# Patient Record
Sex: Female | Born: 1935 | Race: White | Hispanic: No | Smoking: Former smoker
Health system: Southern US, Community
[De-identification: ages and names within clinical notes are randomized; demographics above are authoritative.]

## PROBLEM LIST (undated history)

## (undated) DIAGNOSIS — F32A Depression, unspecified: Secondary | ICD-10-CM

## (undated) DIAGNOSIS — I272 Pulmonary hypertension, unspecified: Secondary | ICD-10-CM

## (undated) DIAGNOSIS — M48061 Spinal stenosis, lumbar region without neurogenic claudication: Secondary | ICD-10-CM

## (undated) DIAGNOSIS — F329 Major depressive disorder, single episode, unspecified: Secondary | ICD-10-CM

## (undated) DIAGNOSIS — I5032 Chronic diastolic (congestive) heart failure: Secondary | ICD-10-CM

## (undated) DIAGNOSIS — I509 Heart failure, unspecified: Secondary | ICD-10-CM

## (undated) DIAGNOSIS — F0390 Unspecified dementia without behavioral disturbance: Secondary | ICD-10-CM

## (undated) DIAGNOSIS — C189 Malignant neoplasm of colon, unspecified: Secondary | ICD-10-CM

## (undated) DIAGNOSIS — E039 Hypothyroidism, unspecified: Secondary | ICD-10-CM

## (undated) DIAGNOSIS — I10 Essential (primary) hypertension: Secondary | ICD-10-CM

## (undated) DIAGNOSIS — Z9981 Dependence on supplemental oxygen: Secondary | ICD-10-CM

## (undated) HISTORY — DX: Pulmonary hypertension, unspecified: I27.20

## (undated) HISTORY — DX: Unspecified dementia, unspecified severity, without behavioral disturbance, psychotic disturbance, mood disturbance, and anxiety: F03.90

## (undated) HISTORY — DX: Essential (primary) hypertension: I10

## (undated) HISTORY — DX: Chronic diastolic (congestive) heart failure: I50.32

## (undated) HISTORY — DX: Spinal stenosis, lumbar region without neurogenic claudication: M48.061

## (undated) HISTORY — DX: Hypothyroidism, unspecified: E03.9

## (undated) HISTORY — DX: Depression, unspecified: F32.A

## (undated) HISTORY — DX: Major depressive disorder, single episode, unspecified: F32.9

## (undated) HISTORY — DX: Malignant neoplasm of colon, unspecified: C18.9

---

## 1999-11-28 HISTORY — PX: CARDIAC CATHETERIZATION: SHX172

## 2005-10-27 HISTORY — PX: ABDOMINAL AORTIC ANEURYSM REPAIR: SUR1152

## 2007-09-28 DIAGNOSIS — C189 Malignant neoplasm of colon, unspecified: Secondary | ICD-10-CM

## 2007-09-28 HISTORY — PX: COLON RESECTION: SHX5231

## 2007-09-28 HISTORY — DX: Malignant neoplasm of colon, unspecified: C18.9

## 2007-12-13 ENCOUNTER — Encounter: Payer: Self-pay | Admitting: Internal Medicine

## 2007-12-29 ENCOUNTER — Encounter: Payer: Self-pay | Admitting: Internal Medicine

## 2008-01-26 ENCOUNTER — Encounter: Payer: Self-pay | Admitting: Internal Medicine

## 2009-11-27 HISTORY — PX: OTHER SURGICAL HISTORY: SHX169

## 2011-03-17 ENCOUNTER — Ambulatory Visit: Payer: Self-pay | Admitting: Internal Medicine

## 2011-04-28 ENCOUNTER — Ambulatory Visit: Payer: Self-pay | Admitting: Internal Medicine

## 2011-05-16 ENCOUNTER — Ambulatory Visit: Payer: Self-pay | Admitting: Internal Medicine

## 2011-05-24 ENCOUNTER — Ambulatory Visit: Payer: Self-pay | Admitting: Internal Medicine

## 2011-05-25 LAB — CEA: CEA: 2 ng/mL (ref 0.0–4.7)

## 2011-05-28 ENCOUNTER — Ambulatory Visit: Payer: Self-pay | Admitting: Internal Medicine

## 2011-11-23 ENCOUNTER — Ambulatory Visit: Payer: Self-pay | Admitting: Internal Medicine

## 2011-11-28 ENCOUNTER — Ambulatory Visit: Payer: Self-pay | Admitting: Internal Medicine

## 2012-03-21 LAB — BASIC METABOLIC PANEL
Anion Gap: 8 (ref 7–16)
BUN: 24 mg/dL — ABNORMAL HIGH (ref 7–18)
Calcium, Total: 8.8 mg/dL (ref 8.5–10.1)
Co2: 27 mmol/L (ref 21–32)
Creatinine: 0.92 mg/dL (ref 0.60–1.30)
EGFR (Non-African Amer.): 55 — ABNORMAL LOW
Osmolality: 288 (ref 275–301)
Sodium: 143 mmol/L (ref 136–145)

## 2012-03-21 LAB — CBC
HCT: 34.4 % — ABNORMAL LOW (ref 35.0–47.0)
MCH: 29.7 pg (ref 26.0–34.0)
MCHC: 32.2 g/dL (ref 32.0–36.0)
MCV: 92 fL (ref 80–100)
Platelet: 90 10*3/uL — ABNORMAL LOW (ref 150–440)
RDW: 15.1 % — ABNORMAL HIGH (ref 11.5–14.5)

## 2012-03-21 LAB — TROPONIN I: Troponin-I: <0.02 ng/mL

## 2012-03-22 LAB — TROPONIN I: Troponin-I: 0.02 ng/mL

## 2012-03-22 LAB — CK TOTAL AND CKMB (NOT AT ARMC)
CK, Total: 58 U/L (ref 21–215)
CK-MB: 1.4 ng/mL (ref 0.5–3.6)
CK-MB: 1.1 ng/mL (ref 0.5–3.6)

## 2012-03-22 LAB — LIPID PANEL
Cholesterol: 154 mg/dL (ref 0–200)
HDL Cholesterol: 53 mg/dL (ref 40–60)

## 2012-03-22 LAB — CBC WITH DIFFERENTIAL/PLATELET
Eosinophil #: 0.1 10*3/uL (ref 0.0–0.7)
Eosinophil %: 2.3 %
HGB: 10.5 g/dL — ABNORMAL LOW (ref 12.0–16.0)
MCHC: 32.9 g/dL (ref 32.0–36.0)
Monocyte %: 5.3 %
Neutrophil #: 2.6 10*3/uL (ref 1.4–6.5)
Neutrophil %: 59 %
RBC: 3.49 10*6/uL — ABNORMAL LOW (ref 3.80–5.20)
RDW: 15.1 % — ABNORMAL HIGH (ref 11.5–14.5)
WBC: 4.4 10*3/uL (ref 3.6–11.0)

## 2012-03-22 LAB — URINALYSIS, COMPLETE
Bilirubin,UR: NEGATIVE
Blood: NEGATIVE
Glucose,UR: NEGATIVE mg/dL (ref 0–75)
Hyaline Cast: 3
Nitrite: NEGATIVE
Ph: 5 (ref 4.5–8.0)
Specific Gravity: 1.01 (ref 1.003–1.030)
WBC UR: 10 /HPF (ref 0–5)

## 2012-03-22 LAB — BASIC METABOLIC PANEL
BUN: 22 mg/dL — ABNORMAL HIGH (ref 7–18)
Calcium, Total: 8.4 mg/dL — ABNORMAL LOW (ref 8.5–10.1)
Creatinine: 0.88 mg/dL (ref 0.60–1.30)
Glucose: 123 mg/dL — ABNORMAL HIGH (ref 65–99)
Potassium: 3.5 mmol/L (ref 3.5–5.1)
Sodium: 143 mmol/L (ref 136–145)

## 2012-03-23 ENCOUNTER — Inpatient Hospital Stay: Payer: Self-pay | Admitting: Internal Medicine

## 2012-03-23 LAB — CBC WITH DIFFERENTIAL/PLATELET
Basophil #: 0.1 10*3/uL (ref 0.0–0.1)
Basophil %: 2.8 %
HGB: 10.6 g/dL — ABNORMAL LOW (ref 12.0–16.0)
Neutrophil #: 2 10*3/uL (ref 1.4–6.5)
Neutrophil %: 57.1 %
Platelet: 77 10*3/uL — ABNORMAL LOW (ref 150–440)
RDW: 15.4 % — ABNORMAL HIGH (ref 11.5–14.5)

## 2012-03-23 LAB — CREATININE, SERUM
Creatinine: 0.97 mg/dL (ref 0.60–1.30)
EGFR (African American): >60

## 2012-03-25 LAB — CBC WITH DIFFERENTIAL/PLATELET
Basophil #: 0.1 10*3/uL (ref 0.0–0.1)
Basophil %: 2.5 %
Eosinophil %: 3.2 %
Lymphocyte #: 1.2 10*3/uL (ref 1.0–3.6)
Lymphocyte %: 31.3 %
MCV: 92 fL (ref 80–100)
Monocyte #: 0.2 x10 3/mm (ref 0.2–0.9)
Neutrophil %: 57.5 %
RBC: 3.73 10*6/uL — ABNORMAL LOW (ref 3.80–5.20)
WBC: 3.8 10*3/uL (ref 3.6–11.0)

## 2012-03-26 LAB — CBC WITH DIFFERENTIAL/PLATELET
Basophil #: 0.1 10*3/uL (ref 0.0–0.1)
Basophil %: 2.3 %
Eosinophil %: 3.1 %
HCT: 38.2 % (ref 35.0–47.0)
Lymphocyte #: 1.8 10*3/uL (ref 1.0–3.6)
MCH: 29.6 pg (ref 26.0–34.0)
MCHC: 31.9 g/dL — ABNORMAL LOW (ref 32.0–36.0)
MCV: 93 fL (ref 80–100)
Monocyte #: 0.3 x10 3/mm (ref 0.2–0.9)
Monocyte %: 5.3 %
Neutrophil %: 58.6 %
Platelet: 95 10*3/uL — ABNORMAL LOW (ref 150–440)
RBC: 4.11 10*6/uL (ref 3.80–5.20)
RDW: 14.9 % — ABNORMAL HIGH (ref 11.5–14.5)

## 2012-03-26 LAB — URINE CULTURE

## 2012-03-28 DIAGNOSIS — I10 Essential (primary) hypertension: Secondary | ICD-10-CM

## 2012-03-28 DIAGNOSIS — R55 Syncope and collapse: Secondary | ICD-10-CM

## 2012-03-28 DIAGNOSIS — F068 Other specified mental disorders due to known physiological condition: Secondary | ICD-10-CM

## 2012-04-26 ENCOUNTER — Telehealth: Payer: Self-pay

## 2012-04-26 NOTE — Telephone Encounter (Signed)
Harvie Junior daughter request call back from Dr Alphonsus Sias. Request discharge today from [facility] rehab.; Was told could not discharge until Tues and daughter wants to take pt home today. Pat's # [phone].

## 2012-04-26 NOTE — Telephone Encounter (Signed)
Spoke to [employer] at [employer] Just spoke to Medical illustrator about needing to wait till Tuesday and were okay  Please call her and confirm that they are now okay about waiting till Tuesday

## 2012-04-26 NOTE — Telephone Encounter (Signed)
Spoke to daughter Samantha Dickerson Has oxygen at home Ready to take her back---not going to [place]  Orders given for discharge May need Rx for new med until she sees Dr Delfin Edis

## 2012-08-28 ENCOUNTER — Ambulatory Visit: Payer: Self-pay | Admitting: Internal Medicine

## 2013-04-16 ENCOUNTER — Telehealth: Payer: Self-pay

## 2013-04-16 NOTE — Telephone Encounter (Signed)
Pts daughter, Elease Hashimoto left v/m at 3:50 pm requesting Dr Alphonsus Sias to take Ms Bosak as new pt; pt saw Dr Alphonsus Sias at rehab at [facility]. pts doctor is moving out of town. I called Elease Hashimoto and she had already called back and scheduled appt on 05/21/13 at 11:30.

## 2013-05-21 ENCOUNTER — Encounter: Payer: Self-pay | Admitting: Internal Medicine

## 2013-05-21 ENCOUNTER — Ambulatory Visit (INDEPENDENT_AMBULATORY_CARE_PROVIDER_SITE_OTHER): Payer: Medicare Other | Admitting: Internal Medicine

## 2013-05-21 VITALS — BP 112/60 | HR 61 | Temp 98.9°F | Ht 62.0 in | Wt 185.0 lb

## 2013-05-21 DIAGNOSIS — F0390 Unspecified dementia without behavioral disturbance: Secondary | ICD-10-CM

## 2013-05-21 DIAGNOSIS — M48061 Spinal stenosis, lumbar region without neurogenic claudication: Secondary | ICD-10-CM

## 2013-05-21 DIAGNOSIS — I5032 Chronic diastolic (congestive) heart failure: Secondary | ICD-10-CM | POA: Insufficient documentation

## 2013-05-21 DIAGNOSIS — C189 Malignant neoplasm of colon, unspecified: Secondary | ICD-10-CM | POA: Insufficient documentation

## 2013-05-21 DIAGNOSIS — I2789 Other specified pulmonary heart diseases: Secondary | ICD-10-CM

## 2013-05-21 DIAGNOSIS — E039 Hypothyroidism, unspecified: Secondary | ICD-10-CM | POA: Insufficient documentation

## 2013-05-21 DIAGNOSIS — F329 Major depressive disorder, single episode, unspecified: Secondary | ICD-10-CM

## 2013-05-21 DIAGNOSIS — I272 Pulmonary hypertension, unspecified: Secondary | ICD-10-CM

## 2013-05-21 DIAGNOSIS — I10 Essential (primary) hypertension: Secondary | ICD-10-CM | POA: Insufficient documentation

## 2013-05-21 LAB — HEPATIC FUNCTION PANEL
ALT: 9 U/L (ref 0–35)
AST: 11 U/L (ref 0–37)
Albumin: 4 g/dL (ref 3.5–5.2)
Total Bilirubin: 0.6 mg/dL (ref 0.3–1.2)

## 2013-05-21 LAB — CBC WITH DIFFERENTIAL/PLATELET
Basophils Absolute: 0 10*3/uL (ref 0.0–0.1)
Eosinophils Relative: 2.3 % (ref 0.0–5.0)
HCT: 34.6 % — ABNORMAL LOW (ref 36.0–46.0)
Lymphs Abs: 1.8 10*3/uL (ref 0.7–4.0)
MCV: 92.9 fl (ref 78.0–100.0)
Monocytes Absolute: 0.3 10*3/uL (ref 0.1–1.0)
Monocytes Relative: 4.5 % (ref 3.0–12.0)
Neutrophils Relative %: 63.7 % (ref 43.0–77.0)
Platelets: 84 10*3/uL — ABNORMAL LOW (ref 150.0–400.0)
RDW: 16.4 % — ABNORMAL HIGH (ref 11.5–14.6)
WBC: 6.2 10*3/uL (ref 4.5–10.5)

## 2013-05-21 LAB — BASIC METABOLIC PANEL
BUN: 29 mg/dL — ABNORMAL HIGH (ref 6–23)
Creatinine, Ser: 0.9 mg/dL (ref 0.4–1.2)
GFR: 61.67 mL/min (ref >60.00)
Glucose, Bld: 91 mg/dL (ref 70–99)
Potassium: 3.5 mEq/L (ref 3.5–5.1)

## 2013-05-21 LAB — T4, FREE: Free T4: 1.18 ng/dL (ref 0.60–1.60)

## 2013-05-21 MED ORDER — RIVASTIGMINE TARTRATE 1.5 MG PO CAPS: 1.5000 mg | ORAL_CAPSULE | Freq: Two times a day (BID) | ORAL | Status: DC

## 2013-05-21 MED ORDER — CLOPIDOGREL BISULFATE 75 MG PO TABS: 75.0000 mg | ORAL_TABLET | Freq: Every day | ORAL | Status: DC

## 2013-05-21 MED ORDER — SERTRALINE HCL 50 MG PO TABS: 50.0000 mg | ORAL_TABLET | Freq: Every day | ORAL | Status: DC

## 2013-05-21 MED ORDER — FUROSEMIDE 20 MG PO TABS: 20.0000 mg | ORAL_TABLET | Freq: Every day | ORAL | Status: DC | PRN

## 2013-05-21 MED ORDER — FERROUS SULFATE 325 (65 FE) MG PO TABS: 325.0000 mg | ORAL_TABLET | Freq: Every day | ORAL | Status: AC

## 2013-05-21 MED ORDER — LEVOTHYROXINE SODIUM 100 MCG PO TABS: 100.0000 ug | ORAL_TABLET | Freq: Every day | ORAL | Status: DC

## 2013-05-21 MED ORDER — METOPROLOL SUCCINATE ER 25 MG PO TB24: 25.0000 mg | ORAL_TABLET | Freq: Every day | ORAL | Status: DC

## 2013-05-21 NOTE — Progress Notes (Signed)
Subjective:    Patient ID: Samantha Dickerson, female    DOB: 1920-12-21, 79 y.o.   MRN: [id number]  HPI Here with daughter [name]  I had seen her at [facility] about a year ago Admitted after fall and then to rehab Transfering care as Dr Delfin Edis is leaving the area  Has had dementia Mild when came to live with daughter about 2.5 years ago Now worsening in the past year or so On rivastigmine Goes to [facility] 4 days per week Aide with her Tuesdays and Thursdays Dresses and bathroom by herself. Daughter helps with showers and instrumental ADLs Had wandered out of home in past---now supervised  Uses attends for urinary incontinence Has had recurrent UTIs  HTN for many years Has done well on the Rx  Diastolic CHF Echo done April 2013-- EF 55%, mild MR and TR No edema on meds Does have DOE Some degree of pulmonary hypertension as well---still on oxygen at night  Long standing hypothyroidism On replacement  Has spinal stenosis Has chronic back pain Just uses tylenol  Depression goes back for some time Seems to be doing okay on the sertraline  No current outpatient prescriptions on file prior to visit.   No current facility-administered medications on file prior to visit.    Allergies  Allergen Reactions  . Ace Inhibitors   . Aspirin   . Lisinopril   . Nitroglycerin   . Penicillins     Past Medical History  Diagnosis Date  . Dementia ?2010    unclear etiology  . Hypertension   . Unspecified hypothyroidism   . Colon cancer 11/08    resected and no other Rx  . Chronic diastolic heart failure     EF- 55% (4/13)  . Depression   . Lumbar spinal stenosis     Past Surgical History  Procedure Laterality Date  . Colon resection N/A 11/08    cancer  . Abdominal aortic aneurysm repair N/A 12/06  . Cardiac catheterization  2001    Family History  Problem Relation Age of Onset  . Heart disease Mother   . Heart disease Father   . Cancer Father   . Diabetes Neg Hx    . Cancer Other     History   Social History  . Marital Status: Widowed    Spouse Name: N/A    Number of Children: 5  . Years of Education: N/A   Occupational History  . 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 Smoking status: Former Games developer  . Smokeless tobacco: Never Used  . Alcohol Use: No  . Drug Use: No  . Sexually Active: Not on file   Other Topics Concern  . Not on file   Social History Narrative   Widowed 2002   Lives with daughter [name]       Has living will   Daughter [name] is health care POA   Has DNR order   No tube feeding if cognitively unaware   Review of Systems  Constitutional: Positive for fatigue. Negative for unexpected weight change.  HENT: Positive for hearing loss and dental problem.        Teeth not great---hasn't been to dentist  Eyes: Negative for redness and visual disturbance.  Respiratory: Positive for cough and shortness of breath.   Cardiovascular: Negative for chest pain, palpitations and leg swelling.  Gastrointestinal: Positive for abdominal pain and constipation. Negative for nausea, vomiting and blood in stool.  Occ heartburn Vague epigastric pain  Genitourinary: Negative for dysuria and hematuria.       Past UTIs Ongoing incontinence  Musculoskeletal: Positive for back pain and arthralgias. Negative for joint swelling.       Some pain in hands, shoulders at times  Skin: Negative for rash.       No suspicious lesions Has some chronic keratoses  Allergic/Immunologic: Positive for environmental allergies. Negative for immunocompromised state.       Uses OTC allergy meds prn  Neurological: Positive for weakness and light-headedness. Negative for dizziness and syncope.  Psychiatric/Behavioral: Positive for sleep disturbance. Negative for dysphoric mood. The patient is nervous/anxious.        Doesn't sleep well Longstanding issue Some daytime somnolence  Depression controlled       Objective:    Physical Exam  Constitutional: She appears well-developed and well-nourished. No distress.  HENT:  Mouth/Throat: Oropharynx is clear and moist. No oropharyngeal exudate.  Eyes: Conjunctivae and EOM are normal. Pupils are equal, round, and reactive to light.  Neck: Normal range of motion. Neck supple.  Thyroid palpable but not really enlarged  Cardiovascular: Normal rate, regular rhythm, normal heart sounds and intact distal pulses.  Exam reveals no gallop.   No murmur heard. occ extra beats  Pulmonary/Chest: Effort normal and breath sounds normal. No respiratory distress. She has no wheezes. She has no rales.  Abdominal: Soft. There is no tenderness.  Musculoskeletal: She exhibits no edema and no tenderness.  Thick calves without any pitting  Lymphadenopathy:    She has no cervical adenopathy.  Neurological: She is alert.  Oriented to person only President-- ?? 100- ??  Skin: No rash noted.  seb keratoses  Psychiatric: She has a normal mood and affect.  Appropriate interaction          Assessment & Plan:

## 2013-05-21 NOTE — Assessment & Plan Note (Signed)
Probably Alzheimers but ?vascular On rivastigmine

## 2013-05-21 NOTE — Addendum Note (Signed)
Addended by: Sueanne Margarita on: 05/21/2013 01:33 PM   Modules accepted: Orders

## 2013-05-21 NOTE — Assessment & Plan Note (Signed)
Controlled with the med Some sleep problems--- will try tylenol first

## 2013-05-21 NOTE — Assessment & Plan Note (Signed)
Chronic dyspnea and uses oxygen at night

## 2013-05-21 NOTE — Assessment & Plan Note (Signed)
BP Readings from Last 3 Encounters:  05/21/13 112/60   Good control Continue current Rx

## 2013-05-21 NOTE — Assessment & Plan Note (Signed)
Fluid status seems controlled with the lasix Will check labs

## 2013-05-21 NOTE — Assessment & Plan Note (Signed)
Will check labs on the med 

## 2013-05-21 NOTE — Assessment & Plan Note (Signed)
Recommended regular acetaminophen

## 2013-05-28 ENCOUNTER — Encounter: Payer: Self-pay | Admitting: Internal Medicine

## 2013-06-17 ENCOUNTER — Emergency Department: Payer: Self-pay | Admitting: Emergency Medicine

## 2013-06-17 LAB — COMPREHENSIVE METABOLIC PANEL
Albumin: 3.5 g/dL (ref 3.4–5.0)
Alkaline Phosphatase: 79 U/L (ref 50–136)
Bilirubin,Total: 0.5 mg/dL (ref 0.2–1.0)
Calcium, Total: 8.6 mg/dL (ref 8.5–10.1)
Chloride: 106 mmol/L (ref 98–107)
Creatinine: 0.86 mg/dL (ref 0.60–1.30)
EGFR (Non-African Amer.): 59 — ABNORMAL LOW
Glucose: 89 mg/dL (ref 65–99)
SGOT(AST): 14 U/L — ABNORMAL LOW (ref 15–37)
SGPT (ALT): 11 U/L — ABNORMAL LOW (ref 12–78)
Sodium: 140 mmol/L (ref 136–145)
Total Protein: 6.9 g/dL (ref 6.4–8.2)

## 2013-06-17 LAB — CBC
HCT: 34 % — ABNORMAL LOW (ref 35.0–47.0)
HGB: 11.5 g/dL — ABNORMAL LOW (ref 12.0–16.0)
MCH: 31 pg (ref 26.0–34.0)
MCV: 92 fL (ref 80–100)
RBC: 3.71 10*6/uL — ABNORMAL LOW (ref 3.80–5.20)
WBC: 6.1 10*3/uL (ref 3.6–11.0)

## 2013-06-17 LAB — TROPONIN I: Troponin-I: <0.02 ng/mL

## 2013-06-17 LAB — URINALYSIS, COMPLETE
Bilirubin,UR: NEGATIVE
Ketone: NEGATIVE
Ph: 6 (ref 4.5–8.0)
Protein: NEGATIVE
RBC,UR: 2 /HPF (ref 0–5)
Specific Gravity: 1.008 (ref 1.003–1.030)
Squamous Epithelial: <1
WBC UR: 13 /HPF (ref 0–5)

## 2013-06-20 ENCOUNTER — Encounter: Payer: Self-pay | Admitting: Internal Medicine

## 2013-06-20 ENCOUNTER — Ambulatory Visit (INDEPENDENT_AMBULATORY_CARE_PROVIDER_SITE_OTHER): Payer: Medicare Other | Admitting: Internal Medicine

## 2013-06-20 VITALS — BP 108/60 | HR 72 | Temp 98.0°F

## 2013-06-20 DIAGNOSIS — I498 Other specified cardiac arrhythmias: Secondary | ICD-10-CM | POA: Insufficient documentation

## 2013-06-20 DIAGNOSIS — I5032 Chronic diastolic (congestive) heart failure: Secondary | ICD-10-CM

## 2013-06-20 DIAGNOSIS — K047 Periapical abscess without sinus: Secondary | ICD-10-CM | POA: Insufficient documentation

## 2013-06-20 MED ORDER — CLINDAMYCIN HCL 300 MG PO CAPS: 300.0000 mg | ORAL_CAPSULE | Freq: Three times a day (TID) | ORAL | Status: DC

## 2013-06-20 NOTE — Assessment & Plan Note (Signed)
Seems to be compensated Continue current meds

## 2013-06-20 NOTE — Progress Notes (Signed)
Subjective:    Patient ID: Samantha Dickerson, female    DOB: Oct 19, 1921, 79 y.o.   MRN: [id number]  HPI Here with daughter  Recent trip--all day in airport and planes Next day, was feeling dizzy BP was 139/89 but pulse was low (like 40) Called 911 after couldn't get up to go to bathroom  ER evaluation---reviewed Found bigeminy Has appt next week for follow up with Dr Gwen Pounds  Never passed out---just dizziness and trouble getting up No chest pain No SOB Only had her stomach bothering her  Left face is puffy Hard spot in cheek Seems to be drooping on left  Current Outpatient Prescriptions on File Prior to Visit  Medication Sig Dispense Refill  . acetaminophen (TYLENOL ARTHRITIS PAIN) 650 MG CR tablet Take 650 mg by mouth 3 (three) times daily.       . clopidogrel (PLAVIX) 75 MG tablet Take 1 tablet (75 mg total) by mouth daily.  90 tablet  3  . ferrous sulfate 325 (65 FE) MG tablet Take 1 tablet (325 mg total) by mouth daily with breakfast.  90 tablet  3  . furosemide (LASIX) 20 MG tablet Take 1 tablet (20 mg total) by mouth daily as needed.  90 tablet  3  . levothyroxine (SYNTHROID, LEVOTHROID) 100 MCG tablet Take 1 tablet (100 mcg total) by mouth daily before breakfast.  90 tablet  3  . metoprolol succinate (TOPROL-XL) 25 MG 24 hr tablet Take 1 tablet (25 mg total) by mouth daily.  90 tablet  3  . ranitidine (ZANTAC) 150 MG capsule Take 150 mg by mouth as needed for heartburn.      . rivastigmine (EXELON) 1.5 MG capsule Take 1 capsule (1.5 mg total) by mouth 2 (two) times daily.  180 capsule  3  . sertraline (ZOLOFT) 50 MG tablet Take 1 tablet (50 mg total) by mouth daily.  90 tablet  3   No current facility-administered medications on file prior to visit.    Allergies  Allergen Reactions  . Ace Inhibitors   . Aspirin   . Lisinopril   . Nitroglycerin   . Penicillins     Past Medical History  Diagnosis Date  . Dementia ?2010    unclear etiology  . Hypertension   .  Unspecified hypothyroidism   . Colon cancer 11/08    resected and no other Rx  . Chronic diastolic heart failure     EF- 55% (4/13)  . Depression   . Lumbar spinal stenosis   . Pulmonary hypertension     Past Surgical History  Procedure Laterality Date  . Colon resection N/A 11/08    cancer  . Abdominal aortic aneurysm repair N/A 12/06  . Cardiac catheterization  2001    Family History  Problem Relation Age of Onset  . Heart disease Mother   . Heart disease Father   . Cancer Father   . Diabetes Neg Hx   . Cancer Other     History   Social History  . Marital Status: Widowed    Spouse Name: N/A    Number of Children: 5  . Years of Education: N/A   Occupational History  . 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 Smoking status: Former Games developer  . Smokeless tobacco: Never Used  . Alcohol Use: No  . Drug Use: No  . Sexually Active: Not on file   Other Topics Concern  . Not on file   Social  History Narrative   Widowed 2002   Lives with daughter [name]       Has living will   Daughter [name] is health care POA   Has DNR order   No tube feeding if cognitively unaware   Review of Systems Eating okay No fevers No pain with eating     Objective:   Physical Exam  Constitutional: She appears well-developed and well-nourished. No distress.  HENT:  Red, warm tender nodule overlying left lower molars No oral lesions but tooth looks diseased  Cardiovascular: Normal rate, regular rhythm and normal heart sounds.  Exam reveals no gallop.   Bigeminal rhythm--all beats perfuse peripherally  Pulmonary/Chest: Effort normal and breath sounds normal. No respiratory distress. She has no wheezes. She has no rales.  Psychiatric: She has a normal mood and affect. Her behavior is normal.          Assessment & Plan:

## 2013-06-20 NOTE — Assessment & Plan Note (Signed)
All beats perfusing Probably not related to the dizziness (this was probably related to hard travel day the prior day) No further work up indicated

## 2013-06-20 NOTE — Patient Instructions (Signed)
Please set up a dental appointment

## 2013-06-20 NOTE — Assessment & Plan Note (Signed)
Apparent cause of mass and drooping in left cheek Will treat with clinda Needs dental appt

## 2013-06-23 ENCOUNTER — Ambulatory Visit: Payer: Medicare Other | Admitting: Internal Medicine

## 2013-07-11 ENCOUNTER — Ambulatory Visit (INDEPENDENT_AMBULATORY_CARE_PROVIDER_SITE_OTHER): Payer: Medicare Other | Admitting: Internal Medicine

## 2013-07-11 ENCOUNTER — Telehealth: Payer: Self-pay

## 2013-07-11 ENCOUNTER — Encounter: Payer: Self-pay | Admitting: Internal Medicine

## 2013-07-11 VITALS — BP 128/68 | HR 88 | Temp 98.4°F | Wt 183.0 lb

## 2013-07-11 DIAGNOSIS — N390 Urinary tract infection, site not specified: Secondary | ICD-10-CM | POA: Insufficient documentation

## 2013-07-11 LAB — POCT URINALYSIS DIPSTICK
Spec Grav, UA: <=1.005
Urobilinogen, UA: NEGATIVE
pH, UA: 7

## 2013-07-11 MED ORDER — CIPROFLOXACIN HCL 250 MG PO TABS: 250.0000 mg | ORAL_TABLET | Freq: Two times a day (BID) | ORAL | Status: DC

## 2013-07-11 NOTE — Telephone Encounter (Signed)
Will evaluate then 

## 2013-07-11 NOTE — Assessment & Plan Note (Signed)
Had blood on pad but clearly grossly bloody urine Strong leukocytes on dip also  Will treat as UTI cipro for 10 days  Send culture

## 2013-07-11 NOTE — Progress Notes (Signed)
Subjective:    Patient ID: Samantha Dickerson, female    DOB: 26-May-1921, 79 y.o.   MRN: [id number]  HPI Here with daughter  Has been complaining to daughter that she has had some blood on her pads Not sure from where Daughter has seen some small spots but nothing major Patient making a big deal of it  Did complain of some dysuria only today  Daughter notes increased caregiver strain Consulting civil engineer to care for her Obstinate about things (like this possibly minor problem)---- "acting like she is about to die"  Current Outpatient Prescriptions on File Prior to Visit  Medication Sig Dispense Refill  . acetaminophen (TYLENOL ARTHRITIS PAIN) 650 MG CR tablet Take 650 mg by mouth 3 (three) times daily.       . clindamycin (CLEOCIN) 300 MG capsule Take 1 capsule (300 mg total) by mouth 3 (three) times daily.  30 capsule  0  . clopidogrel (PLAVIX) 75 MG tablet Take 1 tablet (75 mg total) by mouth daily.  90 tablet  3  . ferrous sulfate 325 (65 FE) MG tablet Take 1 tablet (325 mg total) by mouth daily with breakfast.  90 tablet  3  . furosemide (LASIX) 20 MG tablet Take 1 tablet (20 mg total) by mouth daily as needed.  90 tablet  3  . levothyroxine (SYNTHROID, LEVOTHROID) 100 MCG tablet Take 1 tablet (100 mcg total) by mouth daily before breakfast.  90 tablet  3  . metoprolol succinate (TOPROL-XL) 25 MG 24 hr tablet Take 1 tablet (25 mg total) by mouth daily.  90 tablet  3  . ranitidine (ZANTAC) 150 MG capsule Take 150 mg by mouth as needed for heartburn.      . rivastigmine (EXELON) 1.5 MG capsule Take 1 capsule (1.5 mg total) by mouth 2 (two) times daily.  180 capsule  3  . sertraline (ZOLOFT) 50 MG tablet Take 1 tablet (50 mg total) by mouth daily.  90 tablet  3   No current facility-administered medications on file prior to visit.    Allergies  Allergen Reactions  . Ace Inhibitors   . Aspirin   . Lisinopril   . Nitroglycerin   . Penicillins     Past Medical History  Diagnosis Date  . Dementia  ?2010    unclear etiology  . Hypertension   . Unspecified hypothyroidism   . Colon cancer 11/08    resected and no other Rx  . Chronic diastolic heart failure     EF- 55% (4/13)  . Depression   . Lumbar spinal stenosis   . Pulmonary hypertension     Past Surgical History  Procedure Laterality Date  . Colon resection N/A 11/08    cancer  . Abdominal aortic aneurysm repair N/A 12/06  . Cardiac catheterization  2001    Family History  Problem Relation Age of Onset  . Heart disease Mother   . Heart disease Father   . Cancer Father   . Diabetes Neg Hx   . Cancer Other     History   Social History  . Marital Status: Widowed    Spouse Name: N/A    Number of Children: 5  . Years of Education: N/A   Occupational History  . 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 Smoking status: Former Games developer  . Smokeless tobacco: Never Used  . Alcohol Use: No  . Drug Use: No  . Sexual Activity: Not on file  Other Topics Concern  . Not on file   Social History Narrative   Widowed 2002   Lives with daughter [name]       Has living will   Daughter [name] is health care POA   Has DNR order   No tube feeding if cognitively unaware   Review of Systems No fever No apparent abdominal pain No change in bowels Appetite has been okay    Objective:   Physical Exam  Constitutional: She appears well-developed and well-nourished. No distress.  Abdominal: Soft. She exhibits no distension. There is no tenderness. There is no rebound.  Genitourinary:  No obvious vaginal or rectal lesions          Assessment & Plan:

## 2013-07-11 NOTE — Telephone Encounter (Signed)
[name] pts daughter said on 07/08/13 pt had small spot of blood on pad; on 07/09/13 daughter gave pt shower and could not see evidence of blood but after shower saw small amt of blood on pad again. Pat cannot tell where blood is coming from. Blood on pad is middle to back end of pad. [name] said pt has complained of pain when urinates(/ with dementia) and not sure if voiding more frequently due to encouraging pt to drink fluids. [name] said she would feel better if pt was seen. Scheduled pt for today at 4 pm.

## 2013-07-11 NOTE — Addendum Note (Signed)
Addended by: Sueanne Margarita on: 07/11/2013 04:37 PM   Modules accepted: Orders

## 2013-07-19 ENCOUNTER — Emergency Department: Payer: Self-pay | Admitting: Emergency Medicine

## 2013-07-19 LAB — URINALYSIS, COMPLETE
Squamous Epithelial: 40
WBC UR: 68 /HPF (ref 0–5)

## 2013-07-21 ENCOUNTER — Telehealth: Payer: Self-pay | Admitting: Family Medicine

## 2013-07-21 LAB — URINE CULTURE

## 2013-07-21 NOTE — Telephone Encounter (Signed)
[facility] Triage Call Report Triage Record Num: [id number] Operator: Jeraldine Loots Patient Name: Samantha Dickerson Call Date & Time: 07/19/2013 5:20:43PM Patient Phone: [phone] PCP: Tillman Abide Patient Gender: Female PCP Fax : [phone] Patient DOB: July 28, 1921 Practice Name: [facility] Reason for Call: Caller: [name]; PCP: Tillman Abide (Family Practice); CB#: [phone]; Call regarding Urinary Pain/Bleeding; Seen on 8/15 for UTI and started on Cipro 250mg  po bid. Has 1 1/2 days left. Was getting some relief with the pain and decreased blood seen. Today she had increased blood, "like a menstrual flow". The initial u/a had blood. No fever that she is aware of. Triaged per Bloody urine, needs to be seen. Sent to UC at [facility]. Protocol(s) Used: Bloody Urine Recommended Outcome per Protocol: Call Provider Immediately Reason for Outcome: Having more bleeding OR bleeding is lasting longer than expected as defined by provider's follow-up precautions Care Advice: ~ 07/19/2013 5:27:12PM Page 1 of 1 [id number]

## 2013-07-21 NOTE — Telephone Encounter (Signed)
Please check on her today

## 2013-07-22 ENCOUNTER — Telehealth: Payer: Self-pay | Admitting: Internal Medicine

## 2013-07-22 MED ORDER — SULFAMETHOXAZOLE-TMP DS 800-160 MG PO TABS: 1.0000 | ORAL_TABLET | Freq: Two times a day (BID) | ORAL | Status: DC

## 2013-07-22 NOTE — Telephone Encounter (Signed)
Will change her to the septra DS Never got the phone call about the culture and so never used the sulfa  May need urology eval but this will be difficult with her dementia May have bladder lesion if bleeding doesn't respond to the antibiotic  Advised to bring to ER if the confusion worsens

## 2013-07-22 NOTE — Telephone Encounter (Signed)
Daughter, [name] callled and pt is still having issues with UTI and blood in urine.  Went to [facility] ER on 07/19/13 and would like to talk to Dr, Alphonsus Sias about the next step.  Best number to call [name] is [phone] cell or work # [phone].

## 2013-07-22 NOTE — Telephone Encounter (Signed)
Had to go to ER on 8/23 for bright red blood in urine still  Changed to macrobid Noted to be weak and shaky at the [facility] yesterday Appetite is okay  Noted some trouble voiding--- but still seeing urine and blood on her pads Aide found her in bed later in day almost naked Increasing confusion

## 2013-07-25 ENCOUNTER — Encounter: Payer: Self-pay | Admitting: Internal Medicine

## 2013-07-26 ENCOUNTER — Emergency Department: Payer: Self-pay | Admitting: Emergency Medicine

## 2013-07-26 LAB — COMPREHENSIVE METABOLIC PANEL
Albumin: 3 g/dL — ABNORMAL LOW (ref 3.4–5.0)
Alkaline Phosphatase: 68 U/L (ref 50–136)
Anion Gap: 7 (ref 7–16)
BUN: 23 mg/dL — ABNORMAL HIGH (ref 7–18)
Bilirubin,Total: 0.4 mg/dL (ref 0.2–1.0)
Calcium, Total: 8.4 mg/dL — ABNORMAL LOW (ref 8.5–10.1)
Creatinine: 1.47 mg/dL — ABNORMAL HIGH (ref 0.60–1.30)
EGFR (African American): 36 — ABNORMAL LOW
EGFR (Non-African Amer.): 31 — ABNORMAL LOW
Glucose: 109 mg/dL — ABNORMAL HIGH (ref 65–99)
Potassium: 3.8 mmol/L (ref 3.5–5.1)
SGOT(AST): 12 U/L — ABNORMAL LOW (ref 15–37)
SGPT (ALT): 13 U/L (ref 12–78)
Sodium: 140 mmol/L (ref 136–145)

## 2013-07-26 LAB — URINALYSIS, COMPLETE
Glucose,UR: NEGATIVE mg/dL (ref 0–75)
Nitrite: POSITIVE
Ph: 8 (ref 4.5–8.0)
Protein: >=500
RBC,UR: 6449 /HPF (ref 0–5)
WBC UR: 209 /HPF (ref 0–5)

## 2013-07-26 LAB — CBC
HGB: 10.1 g/dL — ABNORMAL LOW (ref 12.0–16.0)
MCHC: 33.6 g/dL (ref 32.0–36.0)
MCV: 91 fL (ref 80–100)
RBC: 3.28 10*6/uL — ABNORMAL LOW (ref 3.80–5.20)
RDW: 14.8 % — ABNORMAL HIGH (ref 11.5–14.5)
WBC: 6.7 10*3/uL (ref 3.6–11.0)

## 2013-07-29 ENCOUNTER — Encounter: Payer: Self-pay | Admitting: Internal Medicine

## 2013-07-31 ENCOUNTER — Telehealth: Payer: Self-pay | Admitting: *Deleted

## 2013-07-31 LAB — URINE CULTURE

## 2013-07-31 NOTE — Telephone Encounter (Signed)
Report faxed to Dr. Isabel Caprice @ [facility]

## 2013-07-31 NOTE — Telephone Encounter (Signed)
Call from [facility] ED about pt's urine culture report ( on your desk) per the nurse this is resistant to everything, she doesn't know what else to do but understands pt is seeing a urologist tomorrow? They did give her Macrobid, but its resistant to that too. please advise

## 2013-07-31 NOTE — Telephone Encounter (Signed)
With multiple organisms, might have something else going on (like a stone, etc) Please fax this report to the urologist

## 2013-08-01 ENCOUNTER — Other Ambulatory Visit: Payer: Self-pay | Admitting: Internal Medicine

## 2013-08-04 NOTE — Telephone Encounter (Signed)
She does not need a refill of this The urine was resistant She has or had appt with urology--- we need to find out what happened at that appt

## 2013-08-05 MED ORDER — SULFAMETHOXAZOLE-TMP DS 800-160 MG PO TABS: 1.0000 | ORAL_TABLET | Freq: Two times a day (BID) | ORAL | Status: DC

## 2013-08-05 NOTE — Telephone Encounter (Signed)
Please check on her

## 2013-08-05 NOTE — Telephone Encounter (Signed)
See email--

## 2013-08-18 ENCOUNTER — Encounter: Payer: Self-pay | Admitting: Internal Medicine

## 2013-09-17 ENCOUNTER — Ambulatory Visit (INDEPENDENT_AMBULATORY_CARE_PROVIDER_SITE_OTHER): Payer: Medicare Other

## 2013-09-17 DIAGNOSIS — Z23 Encounter for immunization: Secondary | ICD-10-CM

## 2013-09-18 ENCOUNTER — Ambulatory Visit (INDEPENDENT_AMBULATORY_CARE_PROVIDER_SITE_OTHER): Payer: Medicare Other | Admitting: Internal Medicine

## 2013-09-18 ENCOUNTER — Encounter: Payer: Self-pay | Admitting: Internal Medicine

## 2013-09-18 VITALS — BP 128/70 | HR 88 | Temp 98.2°F | Ht 62.0 in | Wt 179.0 lb

## 2013-09-18 DIAGNOSIS — F32A Depression, unspecified: Secondary | ICD-10-CM

## 2013-09-18 DIAGNOSIS — I5032 Chronic diastolic (congestive) heart failure: Secondary | ICD-10-CM

## 2013-09-18 DIAGNOSIS — N390 Urinary tract infection, site not specified: Secondary | ICD-10-CM

## 2013-09-18 DIAGNOSIS — M48061 Spinal stenosis, lumbar region without neurogenic claudication: Secondary | ICD-10-CM

## 2013-09-18 DIAGNOSIS — F0390 Unspecified dementia without behavioral disturbance: Secondary | ICD-10-CM

## 2013-09-18 DIAGNOSIS — F329 Major depressive disorder, single episode, unspecified: Secondary | ICD-10-CM

## 2013-09-18 MED ORDER — SERTRALINE HCL 100 MG PO TABS: 100.0000 mg | ORAL_TABLET | Freq: Every day | ORAL | Status: AC

## 2013-09-18 NOTE — Patient Instructions (Signed)
Please increase the sertraline to 100mg daily.

## 2013-09-18 NOTE — Assessment & Plan Note (Signed)
Seems compensated No changes

## 2013-09-18 NOTE — Assessment & Plan Note (Signed)
Has worsened Will increase the sertraline to 100mg  daily

## 2013-09-18 NOTE — Assessment & Plan Note (Addendum)
Long course of parenteral treatment without success If she needs more parenteral Rx--we should have her in hospital and transition to SNF Samantha Dickerson with PICC line) Daughter will not be able to manage her with a foley at home (if that is needed)

## 2013-09-18 NOTE — Assessment & Plan Note (Signed)
Pain doesn't seem to be an issue

## 2013-09-18 NOTE — Assessment & Plan Note (Signed)
Has had increased care needs Discussed seeking placement--will do FL-2 if needed

## 2013-09-18 NOTE — Progress Notes (Signed)
Subjective:    Patient ID: Samantha Dickerson, female    DOB: 1921/10/28, 79 y.o.   MRN: [id number]  HPI Here with 2 daughters-- [name] and [name] (from [place])  Persistent UTI Shots of ceftriaxone for 1 month---daughter giving at home Then oral antibiotic along with it Still with hematuria No fever Not sure what the next step is--will be stopping the antibiotic Considering foley to control incontinence  Needs help for ADLs--- bathing and dressing She will go to bathroom but is incontinent and needs help with incontinence care Walks with rollator  Mood has been an issue lately Feels tired Talking about being ready to die Sleep is variable---often will wander at night  Goes to the [facility] 3 days per week Daughter considering SNF or memory care because care at home has been too difficult  Current Outpatient Prescriptions on File Prior to Visit  Medication Sig Dispense Refill  . acetaminophen (TYLENOL ARTHRITIS PAIN) 650 MG CR tablet Take 650 mg by mouth 3 (three) times daily.       . clopidogrel (PLAVIX) 75 MG tablet Take 1 tablet (75 mg total) by mouth daily.  90 tablet  3  . ferrous sulfate 325 (65 FE) MG tablet Take 1 tablet (325 mg total) by mouth daily with breakfast.  90 tablet  3  . furosemide (LASIX) 20 MG tablet Take 1 tablet (20 mg total) by mouth daily as needed.  90 tablet  3  . levothyroxine (SYNTHROID, LEVOTHROID) 100 MCG tablet Take 1 tablet (100 mcg total) by mouth daily before breakfast.  90 tablet  3  . metoprolol succinate (TOPROL-XL) 25 MG 24 hr tablet Take 1 tablet (25 mg total) by mouth daily.  90 tablet  3  . ranitidine (ZANTAC) 150 MG capsule Take 150 mg by mouth as needed for heartburn.      . rivastigmine (EXELON) 1.5 MG capsule Take 1 capsule (1.5 mg total) by mouth 2 (two) times daily.  180 capsule  3  . sertraline (ZOLOFT) 50 MG tablet Take 1 tablet (50 mg total) by mouth daily.  90 tablet  3   No current facility-administered medications on file prior to  visit.    Allergies  Allergen Reactions  . Ace Inhibitors   . Aspirin   . Lisinopril   . Nitroglycerin   . Penicillins     Past Medical History  Diagnosis Date  . Dementia ?2010    unclear etiology  . Hypertension   . Unspecified hypothyroidism   . Colon cancer 11/08    resected and no other Rx  . Chronic diastolic heart failure     EF- 55% (4/13)  . Depression   . Lumbar spinal stenosis   . Pulmonary hypertension     Past Surgical History  Procedure Laterality Date  . Colon resection N/A 11/08    cancer  . Abdominal aortic aneurysm repair N/A 12/06  . Cardiac catheterization  2001    Family History  Problem Relation Age of Onset  . Heart disease Mother   . Heart disease Father   . Cancer Father   . Diabetes Neg Hx   . Cancer Other     History   Social History  . Marital Status: Widowed    Spouse Name: N/A    Number of Children: 5  . Years of Education: N/A   Occupational History  . 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 Smoking status: Former Games developer  .  Smokeless tobacco: Never Used  . Alcohol Use: No  . Drug Use: No  . Sexual Activity: Not on file   Other Topics Concern  . Not on file   Social History Narrative   Widowed 2002   Lives with daughter [name]       Has living will   Daughter [name] is health care POA   Has DNR order   No tube feeding if cognitively unaware   Review of Systems Appetite is still fine Weight down 4#---some stomach upset from the antibiotics (upper GI)    Objective:   Physical Exam  Constitutional: She appears well-developed and well-nourished. No distress.  Neck: Normal range of motion. Neck supple. No thyromegaly present.  Cardiovascular: Normal rate and regular rhythm.  Exam reveals no gallop.   Murmur heard. Soft systolic murmur  Pulmonary/Chest: Effort normal and breath sounds normal. No respiratory distress. She has no wheezes. She has no rales.  Abdominal: Soft. There is  tenderness.  Suprapubic tenderness  Musculoskeletal: She exhibits no edema and no tenderness.  Lymphadenopathy:    She has no cervical adenopathy.  Psychiatric: She has a normal mood and affect. Her behavior is normal.          Assessment & Plan:

## 2013-09-22 ENCOUNTER — Ambulatory Visit (INDEPENDENT_AMBULATORY_CARE_PROVIDER_SITE_OTHER): Payer: Medicare Other | Admitting: *Deleted

## 2013-09-22 DIAGNOSIS — Z111 Encounter for screening for respiratory tuberculosis: Secondary | ICD-10-CM

## 2013-09-25 ENCOUNTER — Other Ambulatory Visit: Payer: Self-pay | Admitting: Urology

## 2013-09-25 LAB — TB SKIN TEST: TB Skin Test: NEGATIVE

## 2013-09-25 NOTE — Progress Notes (Signed)
Need orders in EPIC.  Surgery scheduled for 10/03/13.  Thank You.  

## 2013-09-26 ENCOUNTER — Encounter: Payer: Self-pay | Admitting: Internal Medicine

## 2013-09-26 ENCOUNTER — Ambulatory Visit (INDEPENDENT_AMBULATORY_CARE_PROVIDER_SITE_OTHER): Payer: Medicare Other | Admitting: Internal Medicine

## 2013-09-26 DIAGNOSIS — N390 Urinary tract infection, site not specified: Secondary | ICD-10-CM

## 2013-09-26 NOTE — Progress Notes (Signed)
Subjective:    Patient ID: Samantha Dickerson, female    DOB: [birth year], 79 y.o.   MRN: [id number]  HPI Daughter here for consultation about mom's urologic problems  Proposed cystoscopy and biopsy-- saw irregular area Now off the antibiotics Still with hematuria No fever though  Has had more confusion lately   Current Outpatient Prescriptions on File Prior to Visit  Medication Sig Dispense Refill  . acetaminophen (TYLENOL ARTHRITIS PAIN) 650 MG CR tablet Take 650 mg by mouth 3 (three) times daily.       . clopidogrel (PLAVIX) 75 MG tablet Take 1 tablet (75 mg total) by mouth daily.  90 tablet  3  . ferrous sulfate 325 (65 FE) MG tablet Take 1 tablet (325 mg total) by mouth daily with breakfast.  90 tablet  3  . furosemide (LASIX) 20 MG tablet Take 1 tablet (20 mg total) by mouth daily as needed.  90 tablet  3  . levothyroxine (SYNTHROID, LEVOTHROID) 100 MCG tablet Take 1 tablet (100 mcg total) by mouth daily before breakfast.  90 tablet  3  . metoprolol succinate (TOPROL-XL) 25 MG 24 hr tablet Take 1 tablet (25 mg total) by mouth daily.  90 tablet  3  . ranitidine (ZANTAC) 150 MG capsule Take 150 mg by mouth as needed for heartburn.      . rivastigmine (EXELON) 1.5 MG capsule Take 1 capsule (1.5 mg total) by mouth 2 (two) times daily.  180 capsule  3  . sertraline (ZOLOFT) 100 MG tablet Take 1 tablet (100 mg total) by mouth daily.  30 tablet  11   No current facility-administered medications on file prior to visit.    Allergies  Allergen Reactions  . Ace Inhibitors   . Aspirin   . Lisinopril   . Nitroglycerin   . Penicillins     Past Medical History  Diagnosis Date  . Dementia ?2010    unclear etiology  . Hypertension   . Unspecified hypothyroidism   . Colon cancer 11/08    resected and no other Rx  . Chronic diastolic heart failure     EF- 55% (4/13)  . Depression   . Lumbar spinal stenosis   . Pulmonary hypertension     Past Surgical History  Procedure Laterality  Date  . Colon resection N/A 11/08    cancer  . Abdominal aortic aneurysm repair N/A 12/06  . Cardiac catheterization  2001    Family History  Problem Relation Age of Onset  . Heart disease Mother   . Heart disease Father   . Cancer Father   . Diabetes Neg Hx   . Cancer Other     History   Social History  . Marital Status: Widowed    Spouse Name: N/A    Number of Children: 5  . Years of Education: N/A   Occupational History  . 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 Smoking status: Former Games developer  . Smokeless tobacco: Never Used  . Alcohol Use: No  . Drug Use: No  . Sexual Activity: Not on file   Other Topics Concern  . Not on file   Social History Narrative   Widowed 2002   Lives with daughter Samantha Dickerson       Has living will   Daughter Samantha Dickerson is health care POA   Has DNR order   No tube feeding if cognitively unaware   Review of Systems     Objective:  Physical Exam        Assessment & Plan:

## 2013-09-26 NOTE — Assessment & Plan Note (Signed)
With persistence and intermittent hematuria Some abnormality seen on office cystoscopy  I advised that we proceed with the expanded cystoscopy and probable biopsy to define what we are dealing with

## 2013-09-29 ENCOUNTER — Encounter (HOSPITAL_COMMUNITY): Payer: Self-pay | Admitting: Pharmacy Technician

## 2013-09-30 ENCOUNTER — Other Ambulatory Visit (HOSPITAL_COMMUNITY): Payer: Self-pay | Admitting: *Deleted

## 2013-10-01 ENCOUNTER — Encounter (HOSPITAL_COMMUNITY): Payer: Self-pay

## 2013-10-01 ENCOUNTER — Encounter (HOSPITAL_COMMUNITY)
Admission: RE | Admit: 2013-10-01 | Discharge: 2013-10-01 | Disposition: A | Discharge status: Home / Self Care | Payer: Medicare Other | Source: Ambulatory Visit | Attending: Urology | Admitting: Urology

## 2013-10-01 ENCOUNTER — Ambulatory Visit (HOSPITAL_COMMUNITY)
Admission: RE | Admit: 2013-10-01 | Discharge: 2013-10-01 | Disposition: A | Discharge status: Home / Self Care | Payer: Medicare Other | Source: Ambulatory Visit | Attending: Urology | Admitting: Urology

## 2013-10-01 DIAGNOSIS — Z01812 Encounter for preprocedural laboratory examination: Secondary | ICD-10-CM | POA: Insufficient documentation

## 2013-10-01 DIAGNOSIS — Z01810 Encounter for preprocedural cardiovascular examination: Secondary | ICD-10-CM | POA: Insufficient documentation

## 2013-10-01 DIAGNOSIS — Z01818 Encounter for other preprocedural examination: Secondary | ICD-10-CM | POA: Insufficient documentation

## 2013-10-01 DIAGNOSIS — R310 Gross hematuria: Secondary | ICD-10-CM | POA: Insufficient documentation

## 2013-10-01 HISTORY — DX: Heart failure, unspecified: I50.9

## 2013-10-01 HISTORY — DX: Dependence on supplemental oxygen: Z99.81

## 2013-10-01 LAB — BASIC METABOLIC PANEL
BUN: 38 mg/dL — ABNORMAL HIGH (ref 6–23)
CO2: 24 mEq/L (ref 19–32)
Calcium: 9.1 mg/dL (ref 8.4–10.5)
Creatinine, Ser: 1.37 mg/dL — ABNORMAL HIGH (ref 0.50–1.10)
GFR calc Af Amer: 38 mL/min — ABNORMAL LOW (ref >90)
Sodium: 137 mEq/L (ref 135–145)

## 2013-10-01 LAB — CBC
MCV: 91 fL (ref 78.0–100.0)
Platelets: 185 10*3/uL (ref 150–400)
RDW: 15.5 % (ref 11.5–15.5)
WBC: 11.7 10*3/uL — ABNORMAL HIGH (ref 4.0–10.5)

## 2013-10-01 NOTE — Progress Notes (Signed)
bmet results faxed to dr herrcik by epic

## 2013-10-01 NOTE — Progress Notes (Addendum)
Medical clearance note dr Alphonsus Sias on chart lov note dr Gwen Pounds cardiologo on chart

## 2013-10-01 NOTE — Patient Instructions (Addendum)
20 LINDEN MIKES  10/01/2013   Your procedure is scheduled on: Friday November 7th  Report to [facility] at  1000 AM.  Call this number if you have problems the morning of surgery [phone]   Remember:   Do not eat food or drink liquids :After Midnight.     Take these medicines the morning of surgery with A SIP OF WATER: metoprolol succinate, rivastiamine,  sertraline, levothyroxine                                SEE [facility] PREPARING FOR SURGERY SHEET             You may not have any metal on your body including hair pins and piercings  Do not wear jewelry, make-up.  Do not wear lotions, powders, or perfumes. You may wear deodorant.   Men may shave face and neck.  Do not bring valuables to the hospital. [facility] IS NOT RESPONSIBLE FOR VALUEABLES.  Contacts, dentures or bridgework may not be worn into surgery.  Leave suitcase in the car. After surgery it may be brought to your room.  For patients admitted to the hospital, checkout time is 11:00 AM the day of discharge.   Patients discharged the day of surgery will not be allowed to drive home.  Name and phone number of your driver: daughter Gwyneth Sprout cell [phone]  Special Instructions: N/A   Please read over the following fact sheets that you were given: [facility] preparing for surgery sheet  Call Cain Sieve RN pre op nurse if needed [phone]    FAILURE TO FOLLOW THESE INSTRUCTIONS MAY RESULT IN THE CANCELLATION OF YOUR SURGERY.  PATIENT SIGNATURE___________________________________________  NURSE SIGNATURE_____________________________________________

## 2013-10-02 MED ORDER — GENTAMICIN SULFATE 40 MG/ML IJ SOLN
300.0000 mg | INTRAVENOUS | Status: AC
  Administered 2013-10-03: 300 mg via INTRAVENOUS
  Filled 2013-10-02: qty 7.5

## 2013-10-02 NOTE — Progress Notes (Signed)
ekg repeated per dr Alphonsus Sias request  On pre op clearance noteat pre op visit 10-01-13, ekg results from 10-01-13 and ekg 06-17-13 in epic

## 2013-10-03 ENCOUNTER — Encounter (HOSPITAL_COMMUNITY): Payer: Medicare Other | Admitting: Anesthesiology

## 2013-10-03 ENCOUNTER — Encounter (HOSPITAL_COMMUNITY): Payer: Self-pay | Admitting: *Deleted

## 2013-10-03 ENCOUNTER — Inpatient Hospital Stay (HOSPITAL_COMMUNITY)
Admission: RE | Admit: 2013-10-03 | Discharge: 2013-10-07 | DRG: 669 | Disposition: A | Discharge status: Skilled Nursing Facility | Payer: Medicare Other | Source: Ambulatory Visit | Attending: Urology | Admitting: Urology

## 2013-10-03 ENCOUNTER — Encounter (HOSPITAL_COMMUNITY)
Admission: RE | Disposition: A | Discharge status: Skilled Nursing Facility | Payer: Self-pay | Source: Ambulatory Visit | Attending: Urology

## 2013-10-03 ENCOUNTER — Ambulatory Visit (HOSPITAL_COMMUNITY): Payer: Medicare Other | Admitting: Anesthesiology

## 2013-10-03 DIAGNOSIS — D62 Acute posthemorrhagic anemia: Secondary | ICD-10-CM | POA: Diagnosis not present

## 2013-10-03 DIAGNOSIS — Z87891 Personal history of nicotine dependence: Secondary | ICD-10-CM

## 2013-10-03 DIAGNOSIS — Z880 Allergy status to penicillin: Secondary | ICD-10-CM

## 2013-10-03 DIAGNOSIS — I10 Essential (primary) hypertension: Secondary | ICD-10-CM | POA: Diagnosis present

## 2013-10-03 DIAGNOSIS — F0280 Dementia in other diseases classified elsewhere without behavioral disturbance: Secondary | ICD-10-CM | POA: Diagnosis present

## 2013-10-03 DIAGNOSIS — N135 Crossing vessel and stricture of ureter without hydronephrosis: Secondary | ICD-10-CM | POA: Diagnosis present

## 2013-10-03 DIAGNOSIS — M129 Arthropathy, unspecified: Secondary | ICD-10-CM | POA: Diagnosis present

## 2013-10-03 DIAGNOSIS — G309 Alzheimer's disease, unspecified: Secondary | ICD-10-CM | POA: Diagnosis present

## 2013-10-03 DIAGNOSIS — C679 Malignant neoplasm of bladder, unspecified: Principal | ICD-10-CM | POA: Diagnosis present

## 2013-10-03 DIAGNOSIS — Z886 Allergy status to analgesic agent status: Secondary | ICD-10-CM

## 2013-10-03 DIAGNOSIS — Z9981 Dependence on supplemental oxygen: Secondary | ICD-10-CM

## 2013-10-03 DIAGNOSIS — E039 Hypothyroidism, unspecified: Secondary | ICD-10-CM | POA: Diagnosis present

## 2013-10-03 DIAGNOSIS — N1330 Unspecified hydronephrosis: Secondary | ICD-10-CM | POA: Diagnosis present

## 2013-10-03 DIAGNOSIS — Z79899 Other long term (current) drug therapy: Secondary | ICD-10-CM

## 2013-10-03 DIAGNOSIS — R32 Unspecified urinary incontinence: Secondary | ICD-10-CM | POA: Diagnosis present

## 2013-10-03 DIAGNOSIS — I2789 Other specified pulmonary heart diseases: Secondary | ICD-10-CM | POA: Diagnosis present

## 2013-10-03 DIAGNOSIS — Z66 Do not resuscitate: Secondary | ICD-10-CM | POA: Diagnosis present

## 2013-10-03 DIAGNOSIS — Z85038 Personal history of other malignant neoplasm of large intestine: Secondary | ICD-10-CM

## 2013-10-03 DIAGNOSIS — F329 Major depressive disorder, single episode, unspecified: Secondary | ICD-10-CM | POA: Diagnosis present

## 2013-10-03 DIAGNOSIS — Z87440 Personal history of urinary (tract) infections: Secondary | ICD-10-CM

## 2013-10-03 DIAGNOSIS — I509 Heart failure, unspecified: Secondary | ICD-10-CM | POA: Diagnosis present

## 2013-10-03 DIAGNOSIS — F3289 Other specified depressive episodes: Secondary | ICD-10-CM | POA: Diagnosis present

## 2013-10-03 DIAGNOSIS — I5032 Chronic diastolic (congestive) heart failure: Secondary | ICD-10-CM | POA: Diagnosis present

## 2013-10-03 DIAGNOSIS — Z9889 Other specified postprocedural states: Secondary | ICD-10-CM

## 2013-10-03 HISTORY — PX: TRANSURETHRAL RESECTION OF BLADDER TUMOR: SHX2575

## 2013-10-03 LAB — BASIC METABOLIC PANEL
BUN: 33 mg/dL — ABNORMAL HIGH (ref 6–23)
CO2: 23 mEq/L (ref 19–32)
Calcium: 8.4 mg/dL (ref 8.4–10.5)
Chloride: 102 mEq/L (ref 96–112)
Creatinine, Ser: 1.39 mg/dL — ABNORMAL HIGH (ref 0.50–1.10)
GFR calc Af Amer: 37 mL/min — ABNORMAL LOW (ref >90)
Potassium: 4.4 mEq/L (ref 3.5–5.1)

## 2013-10-03 LAB — CBC
HCT: 28.4 % — ABNORMAL LOW (ref 36.0–46.0)
Hemoglobin: 9.1 g/dL — ABNORMAL LOW (ref 12.0–15.0)
MCV: 89.3 fL (ref 78.0–100.0)
RBC: 3.18 MIL/uL — ABNORMAL LOW (ref 3.87–5.11)
RDW: 15.2 % (ref 11.5–15.5)
WBC: 12.4 10*3/uL — ABNORMAL HIGH (ref 4.0–10.5)

## 2013-10-03 LAB — ABO/RH: ABO/RH(D): O POS

## 2013-10-03 SURGERY — TURBT (TRANSURETHRAL RESECTION OF BLADDER TUMOR)
Anesthesia: General | Wound class: Clean Contaminated

## 2013-10-03 MED ORDER — FERROUS SULFATE 325 (65 FE) MG PO TABS
325.0000 mg | ORAL_TABLET | Freq: Every day | ORAL | Status: DC
  Administered 2013-10-04 – 2013-10-06 (×3): 325 mg via ORAL
  Filled 2013-10-03 (×4): qty 1

## 2013-10-03 MED ORDER — ACETAMINOPHEN 500 MG PO TABS
1000.0000 mg | ORAL_TABLET | Freq: Four times a day (QID) | ORAL | Status: DC | PRN
  Administered 2013-10-04: 1000 mg via ORAL
  Filled 2013-10-03: qty 2

## 2013-10-03 MED ORDER — SODIUM CHLORIDE 0.9 % IR SOLN
Status: DC | PRN
  Administered 2013-10-03: 88000 mL

## 2013-10-03 MED ORDER — KCL IN DEXTROSE-NACL 20-5-0.45 MEQ/L-%-% IV SOLN
INTRAVENOUS | Status: DC
  Administered 2013-10-03 – 2013-10-06 (×2): via INTRAVENOUS
  Filled 2013-10-03 (×5): qty 1000

## 2013-10-03 MED ORDER — PHENYLEPHRINE HCL 10 MG/ML IJ SOLN
INTRAMUSCULAR | Status: DC | PRN
  Administered 2013-10-03 (×6): 80 ug via INTRAVENOUS

## 2013-10-03 MED ORDER — PROMETHAZINE HCL 25 MG/ML IJ SOLN: 6.2500 mg | INTRAMUSCULAR | Status: DC | PRN

## 2013-10-03 MED ORDER — SERTRALINE HCL 100 MG PO TABS
100.0000 mg | ORAL_TABLET | Freq: Every day | ORAL | Status: DC
  Administered 2013-10-04 – 2013-10-07 (×4): 100 mg via ORAL
  Filled 2013-10-03 (×4): qty 1

## 2013-10-03 MED ORDER — FENTANYL CITRATE 0.05 MG/ML IJ SOLN
25.0000 ug | INTRAMUSCULAR | Status: DC | PRN
  Administered 2013-10-03: 50 ug via INTRAVENOUS

## 2013-10-03 MED ORDER — LINEZOLID 2 MG/ML IV SOLN
600.0000 mg | Freq: Two times a day (BID) | INTRAVENOUS | Status: DC
  Administered 2013-10-03: 600 mg via INTRAVENOUS
  Filled 2013-10-03 (×4): qty 300

## 2013-10-03 MED ORDER — LEVOTHYROXINE SODIUM 100 MCG PO TABS
100.0000 ug | ORAL_TABLET | Freq: Every day | ORAL | Status: DC
  Administered 2013-10-04 – 2013-10-07 (×4): 100 ug via ORAL
  Filled 2013-10-03 (×5): qty 1

## 2013-10-03 MED ORDER — BELLADONNA ALKALOIDS-OPIUM 16.2-60 MG RE SUPP
RECTAL | Status: DC | PRN
  Administered 2013-10-03: 1 via RECTAL

## 2013-10-03 MED ORDER — LACTATED RINGERS IV SOLN
INTRAVENOUS | Status: DC | PRN
  Administered 2013-10-03: 15:00:00 via INTRAVENOUS

## 2013-10-03 MED ORDER — LIDOCAINE HCL (CARDIAC) 20 MG/ML IV SOLN
INTRAVENOUS | Status: DC | PRN
  Administered 2013-10-03: 100 mg via INTRAVENOUS

## 2013-10-03 MED ORDER — FENTANYL CITRATE 0.05 MG/ML IJ SOLN
INTRAMUSCULAR | Status: DC | PRN
  Administered 2013-10-03 (×4): 25 ug via INTRAVENOUS

## 2013-10-03 MED ORDER — SODIUM CHLORIDE 0.9 % IV SOLN
INTRAVENOUS | Status: DC | PRN
  Administered 2013-10-03: 15:00:00 via INTRAVENOUS

## 2013-10-03 MED ORDER — ONDANSETRON HCL 4 MG PO TABS: 4.0000 mg | ORAL_TABLET | Freq: Three times a day (TID) | ORAL | Status: DC | PRN

## 2013-10-03 MED ORDER — PANTOPRAZOLE SODIUM 40 MG PO TBEC
40.0000 mg | DELAYED_RELEASE_TABLET | Freq: Every day | ORAL | Status: DC
  Administered 2013-10-03 – 2013-10-07 (×5): 40 mg via ORAL
  Filled 2013-10-03 (×5): qty 1

## 2013-10-03 MED ORDER — FAMOTIDINE 20 MG PO TABS
20.0000 mg | ORAL_TABLET | Freq: Every day | ORAL | Status: DC
  Administered 2013-10-03 – 2013-10-07 (×5): 20 mg via ORAL
  Filled 2013-10-03 (×5): qty 1

## 2013-10-03 MED ORDER — METOPROLOL SUCCINATE ER 25 MG PO TB24
25.0000 mg | ORAL_TABLET | Freq: Every day | ORAL | Status: DC
  Administered 2013-10-04 – 2013-10-07 (×4): 25 mg via ORAL
  Filled 2013-10-03 (×4): qty 1

## 2013-10-03 MED ORDER — FENTANYL CITRATE 0.05 MG/ML IJ SOLN
INTRAMUSCULAR | Status: AC
  Filled 2013-10-03: qty 2

## 2013-10-03 MED ORDER — STERILE WATER FOR IRRIGATION IR SOLN
Status: DC | PRN
  Administered 2013-10-03: 3000 mL

## 2013-10-03 MED ORDER — RIVASTIGMINE TARTRATE 1.5 MG PO CAPS
1.5000 mg | ORAL_CAPSULE | Freq: Two times a day (BID) | ORAL | Status: DC
  Administered 2013-10-03 – 2013-10-06 (×6): 1.5 mg via ORAL
  Filled 2013-10-03 (×8): qty 1

## 2013-10-03 MED ORDER — LIDOCAINE HCL 2 % EX GEL
CUTANEOUS | Status: AC
  Filled 2013-10-03: qty 10

## 2013-10-03 MED ORDER — ONDANSETRON HCL 4 MG/2ML IJ SOLN
INTRAMUSCULAR | Status: DC | PRN
  Administered 2013-10-03: 4 mg via INTRAVENOUS

## 2013-10-03 MED ORDER — PHENYLEPHRINE HCL 10 MG/ML IJ SOLN
10.0000 mg | INTRAVENOUS | Status: DC | PRN
  Administered 2013-10-03: 15 ug/min via INTRAVENOUS

## 2013-10-03 MED ORDER — MENTHOL 3 MG MT LOZG: 1.0000 | LOZENGE | OROMUCOSAL | Status: DC | PRN

## 2013-10-03 MED ORDER — HEPARIN SODIUM (PORCINE) 5000 UNIT/ML IJ SOLN
5000.0000 [IU] | Freq: Three times a day (TID) | INTRAMUSCULAR | Status: DC
  Administered 2013-10-03 – 2013-10-05 (×5): 5000 [IU] via SUBCUTANEOUS
  Filled 2013-10-03 (×8): qty 1

## 2013-10-03 MED ORDER — PROPOFOL 10 MG/ML IV BOLUS
INTRAVENOUS | Status: DC | PRN
  Administered 2013-10-03: 120 mg via INTRAVENOUS

## 2013-10-03 MED ORDER — MIRABEGRON ER 50 MG PO TB24
50.0000 mg | ORAL_TABLET | Freq: Every evening | ORAL | Status: DC
  Administered 2013-10-03 – 2013-10-06 (×4): 50 mg via ORAL
  Filled 2013-10-03 (×5): qty 1

## 2013-10-03 MED ORDER — PHENOL 1.4 % MT LIQD: 1.0000 | OROMUCOSAL | Status: DC | PRN

## 2013-10-03 MED ORDER — BELLADONNA ALKALOIDS-OPIUM 16.2-60 MG RE SUPP: 1.0000 | Freq: Four times a day (QID) | RECTAL | Status: DC | PRN

## 2013-10-03 MED ORDER — FUROSEMIDE 20 MG PO TABS
20.0000 mg | ORAL_TABLET | Freq: Every day | ORAL | Status: DC | PRN
  Filled 2013-10-03: qty 1

## 2013-10-03 MED ORDER — LACTATED RINGERS IV SOLN
INTRAVENOUS | Status: DC
  Administered 2013-10-03: 1000 mL via INTRAVENOUS
  Administered 2013-10-03: 14:00:00 via INTRAVENOUS

## 2013-10-03 MED ORDER — MEPERIDINE HCL 50 MG/ML IJ SOLN: 6.2500 mg | INTRAMUSCULAR | Status: DC | PRN

## 2013-10-03 MED ORDER — BELLADONNA ALKALOIDS-OPIUM 16.2-60 MG RE SUPP
RECTAL | Status: AC
  Filled 2013-10-03: qty 1

## 2013-10-03 MED ORDER — 0.9 % SODIUM CHLORIDE (POUR BTL) OPTIME
TOPICAL | Status: DC | PRN
  Administered 2013-10-03: 4000 mL

## 2013-10-03 SURGICAL SUPPLY — 21 items
BAG URINE DRAINAGE (UROLOGICAL SUPPLIES) ×2 IMPLANT
BAG URO CATCHER STRL LF (DRAPE) ×2 IMPLANT
CATH FOLEY 3WAY 30CC 22FR (CATHETERS) ×2 IMPLANT
CATH URET 5FR 28IN CONE TIP (BALLOONS)
CATH URET 5FR 70CM CONE TIP (BALLOONS) IMPLANT
CLOTH BEACON ORANGE TIMEOUT ST (SAFETY) ×2 IMPLANT
DRAPE CAMERA CLOSED 9X96 (DRAPES) ×2 IMPLANT
ELECT BUTTON HF 24-28F 2 30DE (ELECTRODE) ×2 IMPLANT
ELECT LOOP MED HF 24F 12D CBL (CLIP) ×2 IMPLANT
EVACUATOR ELLICK (MISCELLANEOUS) ×2 IMPLANT
GLOVE BIOGEL M 8.0 STRL (GLOVE) ×2 IMPLANT
GOWN PREVENTION PLUS XLARGE (GOWN DISPOSABLE) IMPLANT
GOWN STRL REIN XL XLG (GOWN DISPOSABLE) ×6 IMPLANT
GUIDEWIRE STR DUAL SENSOR (WIRE) IMPLANT
KIT ASPIRATION TUBING (SET/KITS/TRAYS/PACK) ×2 IMPLANT
MANIFOLD NEPTUNE II (INSTRUMENTS) ×2 IMPLANT
PACK CYSTO (CUSTOM PROCEDURE TRAY) ×2 IMPLANT
SYR 30ML LL (SYRINGE) ×2 IMPLANT
SYRINGE IRR TOOMEY STRL 70CC (SYRINGE) ×2 IMPLANT
TUBING CONNECTING 10 (TUBING) ×2 IMPLANT
WIRE COONS/BENSON .038X145CM (WIRE) IMPLANT

## 2013-10-03 NOTE — Preoperative (Addendum)
Beta Blockers   Reason not to administer Beta Blockers: Beta Blocker given this am

## 2013-10-03 NOTE — Transfer of Care (Signed)
Immediate Anesthesia Transfer of Care Note  Patient: Samantha Dickerson  Procedure(s) Performed: Procedure(s): TRANSURETHRAL RESECTION OF BLADDER TUMOR (TURBT) (N/A)  Patient Location: PACU  Anesthesia Type:General  Level of Consciousness: awake, alert , oriented and patient cooperative  Airway & Oxygen Therapy: Patient Spontanous Breathing and Patient connected to face mask oxygen  Post-op Assessment: Report given to PACU RN, Post -op Vital signs reviewed and stable and Patient moving all extremities  Post vital signs: Reviewed and stable  Complications: No apparent anesthesia complications

## 2013-10-03 NOTE — H&P (Signed)
History of Present Illness Patient is referred by Dr. Jene Every, M.D. for evaluation and management of multidrug resistant UTIs.     GU History:  This is a 79 year old female with a history of recurrent urinary tract infections. Patient's symptoms started approximately 5 weeks ago. She has been on a number of antibiotics including ciprofloxacin 250 mg twice a day for 10 days, nitrofurantoin 100 mg twice a day 10 days, and Bactrim single strength twice a day 10 days. Her symptoms have progressed despite these antibiotics.. Her most recent culture from August 30 reveals a multidrug resistant UTI with Proteus and VRE. The patient has a history of urinary tract infections over a wedge had been treated with antibiotics. In addition the patient is currently incontinent and spends most of her day in a wet diaper. She does not think that she empties her bladder with each void. The patient is currently suffering from mild Alzheimer's disease and forgets to go to the bathroom periodically.    Urine cultures from July 26, 2013:  > 100 K cfu/mL Proteus mirabilis  Resistant to ampicillin, cefazolin, cefoxitin, ciprofloxacin, levofloxacin, nitrofurantoin, Bactrim.  > 100 K cfu/mL VRE  Resistant to ciprofloxacin, levofloxacin, vancomycin  Sensitive to ampicillin, nitrofurantoin, linezolid, tetracycline     10/7:  Patient has had both VRE and multi-drug resistant proteus mirabilis UTIs and was treated with IM ceftriaxone x 2 weeks and appears to have either a partially treated or recurrent UTI. Will double cover her today with doxycycline and ceftriaxone IM. In addition, will add Myrbetriq to see if we can reduces her urgency and incontinence which may make her dryer and less prone to recurrent UTIs. If she fails this treatment she will need a PICC line for IV antibiotics and potentially an ID consult.  f/u 2 weeks.    10/21: Patient's symptoms are only slightly better after 2 weeks of IM  ceftriaxone and doxycycline. She has periods of time and there is no blood in her urine and her symptoms are improved however she continues to have symptoms despite her treatment. She is completely incontinent, and doesn't really void in the toilet ever. She soaks through her pad, and changes them frequently. However, she is almost always in a wet pad. The Myrbetriq the started 25 mg was not enough to make a significant difference.     I sent her urine for culture today. I doubled her Mybetriq, and will follow-up urine culture. I did not continue her antibiotics today. We may need to place a Foley catheter as well as a PICC line so that we can treat her infection without her sitting around in her wet diaper all day. I'm also considering a cystoscopic evaluation to rule out any other source of her persistent UTI.     10/27: Patient comes in with both her daughters, she continues to have hematuria and complaining of suprapubic pain/burning. She has not been treated with antibiotics. Her last culture grew multidrug resistant enterococcus.  Patient has not had fevers/flank pain. The second daughter has been changing her diaper every two hours and has noted that she hasn't voided on her own, she is just incontinent into the diaper. In addition, she was complaining of the feeling of not being able to urinate.       Past Medical History Problems  1. History of Arthritis (V13.4) 2. History of Colon Cancer (V10.05) 3. History of congestive heart disease (V12.59) 4. History of depression (V11.8) 5. History of hypertension (V12.59)  Surgical History  Problems  1. History of Colon Surgery 2. History of Heart Surgery 3. History of Surgery For Abdominal Aortic Aneurysm  Current Meds 1. BD Luer-Lok Syringe 22G X 1-1/2" 3 ML Miscellaneous; 3mL syrnges with 22G 1&1/2 inch  needles for use with ceftriaxone injections;  Therapy: 17Sep2014 to (Last Rx:08Oct2014)  Requested for: 09Oct2014 Ordered 2.  CefTRIAXone Sodium 1 GM Injection Solution Reconstituted; INJECT 1 GM Daily  Intramuscular;  Therapy: 16Sep2014 to (Evaluate:21Oct2014)  Requested for: 09Oct2014; Last  Rx:08Oct2014 Ordered 3. Clopidogrel Bisulfate 75 MG Oral Tablet;  Therapy: 10May2013 to Recorded 4. Cranberry CAPS;  Therapy: (Recorded:16Sep2014) to Recorded 5. Doxycycline Hyclate 100 MG Oral Capsule; TAKE 1 CAPSULE TWICE DAILY;  Therapy: 07Oct2014 to (Evaluate:21Oct2014); Last Rx:07Oct2014 Ordered 6. Ferrous Sulfate 325 (65 Fe) MG Oral Tablet;  Therapy: (Recorded:16Sep2014) to Recorded 7. Furosemide 20 MG Oral Tablet;  Therapy: 26Nov2013 to Recorded 8. Levothyroxine Sodium 100 MCG Oral Tablet;  Therapy: 25Jun2014 to Recorded 9. Metoprolol Succinate ER 25 MG Oral Tablet Extended Release 24 Hour;  Therapy: 04Jun2014 to Recorded 10. Rivastigmine Tartrate 1.5 MG Oral Capsule;   Therapy: 03Jul2013 to Recorded 11. Sertraline HCl - 50 MG Oral Tablet;   Therapy: 25Jun2014 to Recorded 12. Sulfamethoxazole-TMP DS 800-160 MG Oral Tablet;   Therapy: 26Aug2014 to Recorded  Allergies Medication  1. Aspirin TABS 2. ACE Inhibitors 3. Lisinopril TABS 4. Nitroglycerin SUBL 5. Penicillins  Family History Problems  1. Family history of Cancer 2. Family history of Death In The Family Father   prostate cancer 3. Family history of Death In The Family Mother   heart problems 4. Family history of Family Health Status Number Of Children   1 son 4 daughters 5. Family history of Prostate Cancer (B14.78) : Father  Social History Problems  1. Denied: History of Alcohol Use 2. Caffeine Use 3. 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 since 1999  Vitals Vital Signs [Data Includes: Last 1 Day]  Recorded: 27Oct2014 02:24PM  Blood Pressure: 95 / 59 Temperature: 98.5 F Heart Rate: 76  Results/Data Urine [Data Includes: Last 1 Day]   27Oct2014  COLOR RED   APPEARANCE CLOUDY   SPECIFIC GRAVITY 1.010   pH 8.5   GLUCOSE  NEG mg/dL  BILIRUBIN SMALL   KETONE NEG mg/dL  BLOOD LARGE   PROTEIN > 300 mg/dL  UROBILINOGEN 0.2 mg/dL  NITRITE POS   LEUKOCYTE ESTERASE MOD   SQUAMOUS EPITHELIAL/HPF RARE   WBC 11-20 WBC/hpf  RBC TNTC RBC/hpf  BACTERIA MANY   CRYSTALS Triple Phosphate crystals noted   CASTS NONE SEEN    Procedure  Procedure: Cystoscopy  Chaperone Present: Marchelle Folks.  Indication: Hematuria.  Informed Consent: Risks, benefits, and potential adverse events were discussed and informed consent was obtained from the healthcare power of attorney . Specific risks including, but not limited to bleeding, infection, pain, allergic reaction etc. were explained.  Prep: The patient was prepped with hibiclens.  Anesthesia:. Local anesthesia was administered intraurethrally with 2% lidocaine jelly.  Antibiotic prophylaxis: Ciprofloxacin.  Procedure Note:  Urethral meatus:. No abnormalities.  Anterior urethra: No abnormalities.  Bladder: Visulization was obscured due to blood. The ureteral orifices were not able to be identified. Examination of the bladder demonstrated clot within the bladder a fistula (question of a fistula on the anterior bladder wall) and edema. (large necrotic looking tissue over the anterior bladder dome.)    Assessment I am concerned that the patient has a mass vs bad fistula vs mucosal changes resulting from long standing infection  at the bladder dome, but I was unable to decipher this based on the office cystoscopy. A cancer could explain the worsening incontinence,bladder pain, and hematuria.   End of Encounter Meds  Medication Name Instruction  BD Luer-Lok Syringe 22G X 1-1/2" 3 ML Miscellaneous 3mL syrnges with 22G 1&1/2 inch needles for use with ceftriaxone injections  CefTRIAXone Sodium 1 GM Injection Solution Reconstituted (Rocephin) INJECT 1 GM Daily Intramuscular  Clopidogrel Bisulfate 75 MG Oral Tablet   Cranberry CAPS   Doxycycline Hyclate 100 MG Oral Capsule TAKE 1 CAPSULE  TWICE DAILY.  Ferrous Sulfate 325 (65 Fe) MG Oral Tablet   Furosemide 20 MG Oral Tablet   Levothyroxine Sodium 100 MCG Oral Tablet   Metoprolol Succinate ER 25 MG Oral Tablet Extended Release 24 Hour   Rivastigmine Tartrate 1.5 MG Oral Capsule   Sertraline HCl - 50 MG Oral Tablet   Sulfamethoxazole-TMP DS 800-160 MG Oral Tablet    Plan Gross hematuria  1. Follow-up Schedule Surgery Office  Follow-up  Status: Complete  Done: 27Oct2014 2. Cysto; Status:Complete;   Done: 27Oct2014 3. URINE CYTOLOGY; Status:In Progress - Specimen/Data Collected;   Done: 27Oct2014  Discussion/Summary I explained to the daughters and patient the concern regarding the cystoscopy findings. I told them that I would like to take her to the OR for a bladder washout and potentially biospy the area of concern once i have had a better look at it. I would like for her to come off her plavix for at least the next few weeks while we sort this out to reduce and hopefully prevent any sort of peri-operative bleeding. No one is completely sure why she is on the plavix, but i presume it was started after her AAA repair. We also discussed the option of not doing anything and the likely progression of her current symptoms. For her UTIs I recommended that she continue with the cranberry tablets, start Vit C to acidify the urine and that we could consider starting methenamine once we've had the chance assess her bladder under anesthesia.   As far as the operation was concerned I explained that I would plan to evacuate all the clots, irrigate all the debris, and assess the entire bladder for pathology. I would likely just biopsy an area that would help facilitate a diagnosis and then we would regroup and decide on any further treatment. If the treatment were resection of a bladder tumor and it was something that I could do without significant morbidity that I would take care of under the same anesthesia. I also told them that I recognize  that a big cancer operation and the associated morbidity of this is a big deal and that I would take a very conservative approach.  The major risks of this operation were bladder perforation and the associated risks of anesthesia.  The daughters were going to talk this over with there family and then call us back letting us know if they wanted to pursue operative intervention.

## 2013-10-03 NOTE — Op Note (Signed)
Preoperative diagnosis:  1. Hematuria 2. Recurrent UTIs  Postoperative diagnosis:  1. 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 Cystoscopy 2. TURBT > 5cm  Surgeon: Crist Fat, MD  Anesthesia: General  Complications: None  Intraoperative findings: S  EBL: Minimal  Specimens: None  Indication: RAYLIN DIGUGLIELMO is a 79 y.o. patient with history of recurrent urinary tract infections. Further workup in the office with cystoscopy revealed a cloudy bladder difficult to see , however, it was concerning for malignancy. As such, I recommended the patient be taken to the operating room for evaluation under general anesthesia.  After reviewing the management options for treatment, she elected to proceed with the above surgical procedure(s). We have discussed the potential benefits and risks of the procedure, side effects of the proposed treatment, the likelihood of the patient achieving the goals of the procedure, and any potential problems that might occur during the procedure or recuperation. Informed consent has been obtained.  Description of procedure:  The patient was taken to the operating room and general anesthesia was induced.  The patient was placed in the dorsal lithotomy position, prepped and draped in the usual sterile fashion, and preoperative antibiotics were administered. A preoperative time-out was performed.   I then placed a 30 22 French cystoscope into the patient's bladder under visual guidance. I emptied the patient's bladder and instantly was overwhelmed with a necrotic odor and significant debris floating in the patient's bladder. I then filled the patient's bladder up several times cycling it and attempt to clean out some of the debris. At this point I was able to decipher a large necrotic bladder mass emanating along the right wall of the bladder. I then went and spoke with the family, letting them know of this unexpected finding, in  discussing the options further including doing nothing, or resecting the bladder mass/debulking the tumor. Decision was made to proceed with debulking of the tumor  I then placed a 26 French resectoscope sheath into the patient's urethra with the obturator. I removed the obturator replacing with a loop cautery resectoscope with a 12.5 lens. In a systematic fashion I were to debulk the tumor as best as possible. There was significant bleeding throughout the case, and given the patient's starting hemoglobin of around 8 we opted to transfuse the patient 2 units of packed red blood cells. Periodically throughout the dissection I used the Ellik to remove all resected bladder tumor chips and blood clots. Once the bulk of the tumor had been resected, it became unclear as to where the bladder wall was in relation to the tumor, and where the ureteral orifice these were. Given this uncertainty, and realizing that this mass was for all intents and purposes not one that would be completely resected I opted to stop resecting and to place a three-way 24 [place] Foley catheter. I irrigated the Foley catheter several times ensuring that there was no additional tissue that had not been evacuated and ensured that the bleeding was stopping. I then inflated 30 cc into the balloon, place the patient on continuous bladder irrigation, and placed AP and oh suppository into the patient's rectum.  The patient was subsequently awoken from general anesthesia and extubated. She was returned to the PACU in excellent condition.  Disposition: The patient will be admitted on continuous bladder irrigations, we will plan to place her on telemetry given her cardiac history.  Crist Fat, M.D.

## 2013-10-03 NOTE — Anesthesia Preprocedure Evaluation (Addendum)
Anesthesia Evaluation  Patient identified by MRN, date of birth, ID band Patient awake    Reviewed: Allergy & Precautions, H&P , NPO status , Patient's Chart, lab work & pertinent test results  Airway       Dental  (+) Dental Advisory Given   Pulmonary former smoker,          Cardiovascular hypertension, +CHF negative cardio ROS  + dysrhythmias     Neuro/Psych PSYCHIATRIC DISORDERS Depression negative neurological ROS     GI/Hepatic negative GI ROS, Neg liver ROS,   Endo/Other  Hypothyroidism   Renal/GU negative Renal ROS     Musculoskeletal negative musculoskeletal ROS (+)   Abdominal (+) + obese,   Peds  Hematology negative hematology ROS (+)   Anesthesia Other Findings   Reproductive/Obstetrics negative OB ROS                         Anesthesia Physical Anesthesia Plan  ASA: III  Anesthesia Plan: General   Post-op Pain Management:    Induction: Intravenous  Airway Management Planned: LMA  Additional Equipment:   Intra-op Plan:   Post-operative Plan: Extubation in OR  Informed Consent: I have reviewed the patients History and Physical, chart, labs and discussed the procedure including the risks, benefits and alternatives for the proposed anesthesia with the patient or authorized representative who has indicated his/her understanding and acceptance.   Dental advisory given  Plan Discussed with: CRNA  Anesthesia Plan Comments:         Anesthesia Quick Evaluation

## 2013-10-03 NOTE — Anesthesia Postprocedure Evaluation (Signed)
Anesthesia Post Note  Patient: Samantha Dickerson  Procedure(s) Performed: Procedure(s) (LRB): TRANSURETHRAL RESECTION OF BLADDER TUMOR (TURBT) (N/A)  Anesthesia type: General  Patient location: PACU  Post pain: Pain level controlled  Post assessment: Post-op Vital signs reviewed  Last Vitals: BP 98/48  Pulse 74  Temp(Src) 36.5 C (Oral)  Resp 12  SpO2 95%  Post vital signs: Reviewed  Level of consciousness: sedated  Complications: No apparent anesthesia complications

## 2013-10-04 LAB — CBC
HCT: 24.4 % — ABNORMAL LOW (ref 36.0–46.0)
MCH: 28.4 pg (ref 26.0–34.0)
MCHC: 32 g/dL (ref 30.0–36.0)
MCV: 88.7 fL (ref 78.0–100.0)
Platelets: 138 10*3/uL — ABNORMAL LOW (ref 150–400)
RBC: 2.75 MIL/uL — ABNORMAL LOW (ref 3.87–5.11)
WBC: 9 10*3/uL (ref 4.0–10.5)

## 2013-10-04 LAB — BASIC METABOLIC PANEL
CO2: 24 mEq/L (ref 19–32)
Calcium: 8.3 mg/dL — ABNORMAL LOW (ref 8.4–10.5)
Chloride: 103 mEq/L (ref 96–112)
Creatinine, Ser: 1.48 mg/dL — ABNORMAL HIGH (ref 0.50–1.10)
GFR calc Af Amer: 35 mL/min — ABNORMAL LOW (ref >90)
GFR calc non Af Amer: 30 mL/min — ABNORMAL LOW (ref >90)
Glucose, Bld: 120 mg/dL — ABNORMAL HIGH (ref 70–99)
Sodium: 136 mEq/L (ref 135–145)

## 2013-10-04 NOTE — Progress Notes (Signed)
Urology Inpatient Progress Report  Intv/Subj: No acute events overnight. Patient is without complaint. Some bladder pain, not requiring pain medications CBI on slow gtt, urine clear Patient confused about surgery/hospitalization  Objective: Vital: Filed Vitals:   10/03/13 1815 10/03/13 1857 10/03/13 2129 10/04/13 0620  BP: 98/48 98/48 107/44 104/45  Pulse: 74 74 78 80  Temp: 97.7 F (36.5 C) 97.7 F (36.5 C) 97.5 F (36.4 C) 97.4 F (36.3 C)  TempSrc:  Axillary Oral Oral  Resp: 12  14 14   Height:  5\' 2"  (1.575 m)    Weight:  81.194 kg (179 lb)    SpO2: 95% 95% 100% 100%   I/Os: I/O last 3 completed shifts: In: 96045 [P.O.:50; I.V.:1850; Blood:650; Other:8700] Out: 40981 [Urine:13000; Blood:150]  Past Medical History  Diagnosis Date  . Dementia ?2010    unclear etiology  . Hypertension   . Unspecified hypothyroidism   . Chronic diastolic heart failure     EF- 55% (4/13)  . Depression   . Lumbar spinal stenosis   . Pulmonary hypertension   . On home oxygen therapy     2 liters per [place] at hs  . Colon cancer 11/08    resected and no other Rx  . CHF (congestive heart failure)    Current Facility-Administered Medications  Medication Dose Route Frequency Provider Last Rate Last Dose  . acetaminophen (TYLENOL) tablet 1,000 mg  1,000 mg Oral Q6H PRN Crist Fat, MD      . dextrose 5 % and 0.45 % NaCl with KCl 20 mEq/L infusion   Intravenous Continuous Crist Fat, MD 50 mL/hr at 10/03/13 2329    . famotidine (PEPCID) tablet 20 mg  20 mg Oral Daily Crist Fat, MD   20 mg at 10/03/13 2255  . ferrous sulfate tablet 325 mg  325 mg Oral Q breakfast Crist Fat, MD   325 mg at 10/04/13 1914  . furosemide (LASIX) tablet 20 mg  20 mg Oral Daily PRN Crist Fat, MD      . heparin injection 5,000 Units  5,000 Units Subcutaneous Q8H Crist Fat, MD   5,000 Units at 10/04/13 0530  . levothyroxine (SYNTHROID, LEVOTHROID) tablet 100 mcg  100  mcg Oral QAC breakfast Crist Fat, MD   100 mcg at 10/04/13 [phone]  . menthol-cetylpyridinium (CEPACOL) lozenge 3 mg  1 lozenge Oral PRN Crist Fat, MD      . metoprolol succinate (TOPROL-XL) 24 hr tablet 25 mg  25 mg Oral Daily Crist Fat, MD      . mirabegron ER [facility]) tablet 50 mg  50 mg Oral QPM Crist Fat, MD   50 mg at 10/03/13 2254  . ondansetron (ZOFRAN) tablet 4 mg  4 mg Oral Q8H PRN Crist Fat, MD      . opium-belladonna (B&O SUPPRETTES) suppository 1 suppository  1 suppository Rectal Q6H PRN Crist Fat, MD      . pantoprazole (PROTONIX) EC tablet 40 mg  40 mg Oral Daily Crist Fat, MD   40 mg at 10/03/13 2255  . phenol (CHLORASEPTIC) mouth spray 1 spray  1 spray Mouth/Throat PRN Crist Fat, MD      . rivastigmine (EXELON) capsule 1.5 mg  1.5 mg Oral BID Crist Fat, MD   1.5 mg at 10/04/13 0823  . sertraline (ZOLOFT) tablet 100 mg  100 mg Oral Daily Crist Fat, MD  Physical Exam:  General: Patient is in no apparent distress Lungs: Normal respiratory effort, chest expands symmetrically. GI: The abdomen is soft and nontender without mass. Foley draining clear yellow urine on a slow CBI drip Ext: lower extremities symmetric  Lab Results:  Recent Labs  10/01/13 1445 10/03/13 1801 10/04/13 0522  WBC 11.7* 12.4* 9.0  HGB 8.7* 9.1* 7.8*  HCT 28.4* 28.4* 24.4*    Recent Labs  10/01/13 1445 10/03/13 1801 10/04/13 0522  NA 137 135 136  K 3.9 4.4 4.8  CL 101 102 103  CO2 24 23 24   GLUCOSE 95 141* 120*  BUN 38* 33* 34*  CREATININE 1.37* 1.39* 1.48*  CALCIUM 9.1 8.4 8.3*   No results found for this basename: LABPT, INR,  in the last 72 hours No results found for this basename: LABURIN,  in the last 72 hours Results for orders placed in visit on 07/11/13  URINE CULTURE     Status: None   Collection Time    07/11/13  4:40 PM      Result Value Range Status   Culture KLEBSIELLA  SPECIES   Final   Colony Count >=100,000 COLONIES/ML   Final   Organism ID, Bacteria KLEBSIELLA SPECIES   Final    Studies/Results: None  Assessment: 1 Day Post-Op 1. Patient recovered well from TURBT, urine clearing on slow CBI. Will await final pathology report prior to making any long-term treatment decisions, suspect the patient has high-grade invasive transitional cell carcinoma. Family your patient not eager to proceed with aggressive therapy. 2. Anemia secondary to acute surgical blood loss 3. Alzheimer's disease stable  Plan: wean CBI as able Encouraged ambulation, out of bed to chair today, PT for eval and treatment Gentle IV fluids until patient taking adequate by mouth This and urine culture today to assess whether she needs ongoing with nasal therapy for history of VRE UTI  expect patient will need to be transferred to a skilled nursing facility, I have consulted social work for help in this regard. Resume Alzheimer's home medications, we'll work to keep patient oriented and comfortable.  Berniece Salines W 10/04/2013, 9:01 AM

## 2013-10-04 NOTE — Evaluation (Signed)
Physical Therapy Evaluation Patient Details Name: DEMETRI KERMAN MRN: [id number] DOB: [birth year] Today's Date: 10/04/2013 Time: 4696-2952 PT Time Calculation (min): 20 min  PT Assessment / Plan / Recommendation History of Present Illness  79 year old female with a history of recurrent urinary tract infections. Patient's symptoms started approximately 5 weeks ago. She has been on a number of antibiotics including ciprofloxacin 250 mg twice a day for 10 days, nitrofurantoin 100 mg twice a day 10 days, and Bactrim single strength twice a day 10 days. Her symptoms have progressed despite these antibiotics.. Her most recent culture from August 30 reveals a multidrug resistant UTI with Proteus and VRE. The patient has a history of urinary tract infections over a wedge had been treated with antibiotics. In addition the patient is currently incontinent and spends most of her day in a wet diaper. She does not think that she empties her bladder with each void. The patient is currently suffering from mild Alzheimer's disease and forgets to go to the bathroom periodically. Pt s/p TURBT 11/7 with suspicion for high-grade invasive transitional cell carcinoma    Clinical Impression  Pt admitted with above. Pt currently with functional limitations due to the deficits listed below (see PT Problem List).  Pt will benefit from skilled PT to increase their independence and safety with mobility to allow discharge to the venue listed below.  Daughter plans for pt to d/c to [facility] for rehab.     PT Assessment  Patient needs continued PT services    Follow Up Recommendations  SNF    Does the patient have the potential to tolerate intense rehabilitation      Barriers to Discharge        Equipment Recommendations  None recommended by PT    Recommendations for Other Services     Frequency Min 3X/week    Precautions / Restrictions Precautions Precautions: Fall   Pertinent Vitals/Pain Pt denies pain and  dizziness      Mobility  Bed Mobility Bed Mobility: Supine to Sit Supine to Sit: HOB elevated;5: Supervision Details for Bed Mobility Assistance: verbal cues for technique as pt easily distracted, no assist required Transfers Transfers: Sit to Stand;Stand to Sit Sit to Stand: 4: Min assist;With upper extremity assist;From bed Stand to Sit: 4: Min guard;With upper extremity assist;To chair/3-in-1 Details for Transfer Assistance: verbal cues for safe technique including backing up to chair and not to sit while turning Ambulation/Gait Ambulation/Gait Assistance: 4: Min assist Ambulation Distance (Feet): 80 Feet Assistive device: Rolling walker Ambulation/Gait Assistance Details: verbal cues for technique and keeping pt on task (easily distracted), pt occasionally reach out and hold hand rail and unable to provide reason so cued to maintain grip on RW Gait Pattern: Step-through pattern;Trunk flexed;Decreased stride length Gait velocity: decreased    Exercises     PT Diagnosis: Difficulty walking;Generalized weakness  PT Problem List: Decreased strength;Decreased activity tolerance;Decreased balance;Decreased mobility;Decreased safety awareness;Decreased knowledge of use of DME PT Treatment Interventions: DME instruction;Gait training;Functional mobility training;Therapeutic activities;Therapeutic exercise;Patient/family education;Balance training;Neuromuscular re-education     PT Goals(Current goals can be found in the care plan section) Acute Rehab PT Goals PT Goal Formulation: With patient/family Time For Goal Achievement: 10/18/13 Potential to Achieve Goals: Good  Visit Information  Last PT Received On: 10/04/13 Assistance Needed: +1 History of Present Illness: 79 year old female with a history of recurrent urinary tract infections. Patient's symptoms started approximately 5 weeks ago. She has been on a number of antibiotics including ciprofloxacin 250 mg twice a day  for 10  days, nitrofurantoin 100 mg twice a day 10 days, and Bactrim single strength twice a day 10 days. Her symptoms have progressed despite these antibiotics.. Her most recent culture from August 30 reveals a multidrug resistant UTI with Proteus and VRE. The patient has a history of urinary tract infections over a wedge had been treated with antibiotics. In addition the patient is currently incontinent and spends most of her day in a wet diaper. She does not think that she empties her bladder with each void. The patient is currently suffering from mild Alzheimer's disease and forgets to go to the bathroom periodically. Pt s/p TURBT 11/7 with suspicion for high-grade invasive transitional cell carcinoma         Prior Functioning  Home Living Family/patient expects to be discharged to:: Skilled nursing facility Living Arrangements: Children Prior Function Level of Independence: Independent with assistive device(s) Comments: Daughter reports pt is ambulatory with rollator however occasionally requires assist up stairs (8 steps into house) and bringing legs into car.  Daughter also reports home O2 only at night. Communication Communication: HOH    Cognition  Cognition Arousal/Alertness: Awake/alert Behavior During Therapy: WFL for tasks assessed/performed Overall Cognitive Status: History of cognitive impairments - at baseline (hx dementia)    Extremity/Trunk Assessment Lower Extremity Assessment Lower Extremity Assessment: Generalized weakness   Balance    End of Session PT - End of Session Activity Tolerance: Patient limited by fatigue Patient left: in chair;with call bell/phone within reach;with family/visitor present (daughter aware to call for pt assist back to bed) Nurse Communication: Mobility status (observed pt ambulating in hallway)  GP     Oneal Schoenberger,KATHrine E 10/04/2013, 11:59 AM Zenovia Jarred, PT, DPT 10/04/2013 Pager: [phone]

## 2013-10-04 NOTE — Progress Notes (Signed)
Clinical Social Work Department CLINICAL SOCIAL WORK PLACEMENT NOTE 10/04/2013  Patient:  Samantha Dickerson, Samantha Dickerson  Account Number:  [id number] Admit date:  10/03/2013  Clinical Social Worker:  Doroteo Glassman  Date/time:  10/04/2013 11:08 AM  Clinical Social Work is seeking post-discharge placement for this patient at the following level of care:   SKILLED NURSING   (*CSW will update this form in Epic as items are completed)   10/04/2013  Patient/family provided with [facility] Department of Clinical Social Work's list of facilities offering this level of care within the geographic area requested by the patient (or if unable, by the patient's family).  10/04/2013  Patient/family informed of their freedom to choose among providers that offer the needed level of care, that participate in Medicare, Medicaid or managed care program needed by the patient, have an available bed and are willing to accept the patient.  10/04/2013  Patient/family informed of [facility]' ownership interest in [facility], as well as of the fact that they are under no obligation to receive care at this facility.  PASARR submitted to EDS on  PASARR number received from EDS on   FL2 transmitted to all facilities in geographic area requested by pt/family on  10/04/2013 FL2 transmitted to all facilities within larger geographic area on   Patient informed that his/her managed care company has contracts with or will negotiate with  certain facilities, including the following:     Patient/family informed of bed offers received:   Patient chooses bed at  Physician recommends and patient chooses bed at    Patient to be transferred to  on   Patient to be transferred to facility by   The following physician request were entered in Epic:   Additional Comments:  Providence Crosby, Theresia Majors Clinical Social Work [phone]

## 2013-10-04 NOTE — Progress Notes (Signed)
Clinical Social Work Department BRIEF PSYCHOSOCIAL ASSESSMENT 10/04/2013  Patient:  Samantha Dickerson, Samantha Dickerson     Account Number:  [id number]     Admit date:  10/03/2013  Clinical Social Worker:  Doroteo Glassman  Date/Time:  10/04/2013 11:00 AM  Referred by:  Physician  Date Referred:  10/04/2013 Referred for  SNF Placement   Other Referral:   Interview type:  Other - See comment Other interview type:   Pt's daughter, Mrs. Antony Blackbird    PSYCHOSOCIAL DATA Living Status:  FAMILY Admitted from facility:   Level of care:   Primary support name:  Mrs. Antony Blackbird Primary support relationship to patient:  CHILD, ADULT Degree of support available:   strong    CURRENT CONCERNS Current Concerns  Post-Acute Placement   Other Concerns:    SOCIAL WORK ASSESSMENT / PLAN Met with Pt's daugther at bedside to discuss d/c plans.  Pt was working with PT.    Pt's daughter reported that Pt has been to [facility] in [place] by hx and that Pt would like to go there again. Pt's daughter gave CSW permission to contact that facility and their sister facility to inquire at possible placement.    CSW provided Pt's daughter with a SNF list.    CSW thanked Pt's daughter for her time.   Assessment/plan status:  Psychosocial Support/Ongoing Assessment of Needs Other assessment/ plan:   Information/referral to community resources:   SNF list    PATIENT'S/FAMILY'S RESPONSE TO PLAN OF CARE: Pt's daughter's response to d/c plan was positive.  She has been proactive in securing a place at [facility], as evidenced by her contacting the facility today and making them aware of Pt's needs.  Pt's daugher was cooperative and happy to meet with CSW.    Pt's daughter thanked CSW for time and assistance.   Providence Crosby, LCSWA Clinical Social Work [phone]

## 2013-10-05 ENCOUNTER — Inpatient Hospital Stay (HOSPITAL_COMMUNITY): Payer: Medicare Other

## 2013-10-05 ENCOUNTER — Encounter (HOSPITAL_COMMUNITY): Payer: Self-pay | Admitting: Radiology

## 2013-10-05 LAB — URINE CULTURE

## 2013-10-05 LAB — CBC
HCT: 20.9 % — ABNORMAL LOW (ref 36.0–46.0)
Hemoglobin: 6.5 g/dL — CL (ref 12.0–15.0)
MCH: 28 pg (ref 26.0–34.0)
MCV: 90.1 fL (ref 78.0–100.0)
Platelets: 126 10*3/uL — ABNORMAL LOW (ref 150–400)
RDW: 16 % — ABNORMAL HIGH (ref 11.5–15.5)
WBC: 6.8 10*3/uL (ref 4.0–10.5)

## 2013-10-05 LAB — BASIC METABOLIC PANEL
BUN: 35 mg/dL — ABNORMAL HIGH (ref 6–23)
CO2: 25 mEq/L (ref 19–32)
Calcium: 7.7 mg/dL — ABNORMAL LOW (ref 8.4–10.5)
Chloride: 107 mEq/L (ref 96–112)
Creatinine, Ser: 1.58 mg/dL — ABNORMAL HIGH (ref 0.50–1.10)
GFR calc Af Amer: 32 mL/min — ABNORMAL LOW (ref >90)
Glucose, Bld: 108 mg/dL — ABNORMAL HIGH (ref 70–99)
Potassium: 4.7 mEq/L (ref 3.5–5.1)

## 2013-10-05 MED ORDER — IOHEXOL 300 MG/ML  SOLN
80.0000 mL | Freq: Once | INTRAMUSCULAR | Status: AC | PRN
  Administered 2013-10-05: 80 mL via INTRAVENOUS

## 2013-10-05 MED ORDER — SODIUM BICARBONATE 8.4 % IV SOLN
Freq: Once | INTRAVENOUS | Status: AC
  Administered 2013-10-05: 10:00:00 via INTRAVENOUS
  Filled 2013-10-05: qty 850

## 2013-10-05 MED ORDER — IOHEXOL 300 MG/ML  SOLN
50.0000 mL | Freq: Once | INTRAMUSCULAR | Status: AC | PRN
  Administered 2013-10-05: 08:00:00 50 mL via ORAL

## 2013-10-05 NOTE — Progress Notes (Signed)
Nadine Counts in CT is questioning the order for CT due to pt's increasing creatinine and would like to verify with MD that it needs to be done.  I have reported it to day RN to follow up with pt's MD and clarify the order.

## 2013-10-05 NOTE — Progress Notes (Signed)
Urology Inpatient Progress Report  Intv/Subj: No acute events overnight. Patient have a bit more bladder pain this aM CBI running clear, on slow gtt Seen and evaluated by SW and PT - dispo to SNF for acute rehab Hgb down to 6.5 this AM.  Objective: Vital: Filed Vitals:   10/04/13 1518 10/04/13 2054 10/05/13 0619 10/05/13 0627  BP: 109/44 91/53 89/40  108/60  Pulse: 81 82 80   Temp: 98.1 F (36.7 C) 98.2 F (36.8 C)    TempSrc: Oral Oral Oral   Resp: 15 14 16    Height:      Weight:   81.9 kg (180 lb 8.9 oz)   SpO2: 98% 99% 100%    I/Os: I/O last 3 completed shifts: In: 14782 [P.O.:1010; I.V.:1400; Other:15000] Out: 95621 [Urine:20500]  Past Medical History  Diagnosis Date  . Dementia ?2010    unclear etiology  . Hypertension   . Unspecified hypothyroidism   . Chronic diastolic heart failure     EF- 55% (4/13)  . Depression   . Lumbar spinal stenosis   . Pulmonary hypertension   . On home oxygen therapy     2 liters per [place] at hs  . Colon cancer 11/08    resected and no other Rx  . CHF (congestive heart failure)    Current Facility-Administered Medications  Medication Dose Route Frequency Provider Last Rate Last Dose  . acetaminophen (TYLENOL) tablet 1,000 mg  1,000 mg Oral Q6H PRN Crist Fat, MD   1,000 mg at 10/04/13 1549  . dextrose 5 % and 0.45 % NaCl with KCl 20 mEq/L infusion   Intravenous Continuous Crist Fat, MD 50 mL/hr at 10/03/13 2329    . famotidine (PEPCID) tablet 20 mg  20 mg Oral Daily Crist Fat, MD   20 mg at 10/04/13 0943  . ferrous sulfate tablet 325 mg  325 mg Oral Q breakfast Crist Fat, MD   325 mg at 10/05/13 3086  . furosemide (LASIX) tablet 20 mg  20 mg Oral Daily PRN Crist Fat, MD      . heparin injection 5,000 Units  5,000 Units Subcutaneous Q8H Crist Fat, MD   5,000 Units at 10/05/13 0537  . levothyroxine (SYNTHROID, LEVOTHROID) tablet 100 mcg  100 mcg Oral QAC breakfast Crist Fat, MD   100 mcg at 10/05/13 0809  . menthol-cetylpyridinium (CEPACOL) lozenge 3 mg  1 lozenge Oral PRN Crist Fat, MD      . metoprolol succinate (TOPROL-XL) 24 hr tablet 25 mg  25 mg Oral Daily Crist Fat, MD   25 mg at 10/04/13 0943  . mirabegron ER (MYRBETRIQ) tablet 50 mg  50 mg Oral QPM Crist Fat, MD   50 mg at 10/04/13 1713  . ondansetron (ZOFRAN) tablet 4 mg  4 mg Oral Q8H PRN Crist Fat, MD      . opium-belladonna (B&O SUPPRETTES) suppository 1 suppository  1 suppository Rectal Q6H PRN Crist Fat, MD      . pantoprazole (PROTONIX) EC tablet 40 mg  40 mg Oral Daily Crist Fat, MD   40 mg at 10/04/13 0943  . phenol (CHLORASEPTIC) mouth spray 1 spray  1 spray Mouth/Throat PRN Crist Fat, MD      . rivastigmine (EXELON) capsule 1.5 mg  1.5 mg Oral BID Crist Fat, MD   1.5 mg at 10/05/13 5784  . sertraline (ZOLOFT) tablet 100 mg  100 mg Oral  Daily Crist Fat, MD   100 mg at 10/04/13 0943  . sodium bicarbonate 150 mEq in sterile water 1,000 mL infusion   Intravenous Once Crist Fat, MD        Physical Exam:  General: Patient is in no apparent distress Lungs: Normal respiratory effort, chest expands symmetrically. GI: The abdomen is soft and nontender without mass. Foley draining clear yellow urine on a slow CBI drip Ext: lower extremities symmetric  Lab Results:  Recent Labs  10/03/13 1801 10/04/13 0522 10/05/13 0540  WBC 12.4* 9.0 6.8  HGB 9.1* 7.8* 6.5*  HCT 28.4* 24.4* 20.9*    Recent Labs  10/03/13 1801 10/04/13 0522 10/05/13 0540  NA 135 136 137  K 4.4 4.8 4.7  CL 102 103 107  CO2 23 24 25   GLUCOSE 141* 120* 108*  BUN 33* 34* 35*  CREATININE 1.39* 1.48* 1.58*  CALCIUM 8.4 8.3* 7.7*   No results found for this basename: LABPT, INR,  in the last 72 hours No results found for this basename: LABURIN,  in the last 72 hours Results for orders placed in visit on 07/11/13  URINE  CULTURE     Status: None   Collection Time    07/11/13  4:40 PM      Result Value Range Status   Culture KLEBSIELLA SPECIES   Final   Colony Count >=100,000 COLONIES/ML   Final   Organism ID, Bacteria KLEBSIELLA SPECIES   Final    Studies/Results: None  Assessment: 2 Days Post-Op 1. Patient recovered well from TURBT, urine clearing on slow CBI. Will await final pathology report prior to making any long-term treatment decisions, suspect the patient has high-grade invasive transitional cell carcinoma. Family your patient not eager to proceed with aggressive therapy. 2. Anemia secondary to acute surgical blood loss 3. Alzheimer's disease stable  Plan: wean CBI as able CT scan this AM to evaluate for mets/spread of disease. Encouraged ambulation, out of bed to chair today, PT for eval and treatment Bicarb gtt for CT scan F/u urine culture - assess whether she needs ongoing with nasal therapy for history of VRE UTI  expect patient will need to be transferred to a skilled nursing facility, I have consulted social work for help in this regard. Resume Alzheimer's home medications, we'll work to keep patient oriented and comfortable.  Samantha Dickerson W 10/05/2013, 9:03 AM

## 2013-10-05 NOTE — Progress Notes (Signed)
Appreciate Consult from Dr. Marlou Porch to discuss goals of care given patient's newly diagnosed bladder cancer. I have reviewed her chart in detail and will plan to meet with family tomorrow at the earliest possible time. Unfortunately Ms. Rougeau has a very poor prognosis for long term survival and minimal options for medical interventions that would provide her with both length of life and QOL. Her major issues affecting her prognosis are the following:  1. Newly diagnosed Bladder Cancer, probably high grade transitional cell carcinoma 2. Gradually worsening creatine with right hydronephrosis and right UJ obstruction 3. Baseline dementia with poor functional status, ambulatory with assistance limited by fatigue 4. Her hb is 6.5 today- requiring transfusion 5. Bladder Pain requiring medication which may cause sedation and a cascade of side effects 6. Multiple Co-morbidities: CHF, PAH, Dementia, Home O2  Issues to be addressed with patient and family:  1. Advance Directive-we urgently need to determine her code status given her inpatient status and overall prognosis.  2. Goals of Care: Will discuss current and future medical interventions as optional-would be acceptable to transition her to full comfort care which may include discontinuation of transfusions, antibiotics and avoiding re-hospitalization with primary attention toward pain, dyspnea and comfort measures.  3. Care Coordination: She would be appropriate for Hospice Care at this point, but cannot get Hospice while under medicare rehab benefit at SNF. If family elects for full comfort ie. Not transfusing or treating further resistant infection then she would be appropriate for an Inpatient Hospice Facility- will discuss this option with them.  Full consult note to follow once our team meets with this patient and family.  Anderson Malta, DO Palliative Medicine

## 2013-10-05 NOTE — Progress Notes (Signed)
HGB this am is 6.5.  M. Lynch on called and paged. Awaiting return response.

## 2013-10-06 ENCOUNTER — Encounter (HOSPITAL_COMMUNITY): Payer: Self-pay | Admitting: Urology

## 2013-10-06 ENCOUNTER — Telehealth: Payer: Self-pay | Admitting: Internal Medicine

## 2013-10-06 DIAGNOSIS — M48061 Spinal stenosis, lumbar region without neurogenic claudication: Secondary | ICD-10-CM

## 2013-10-06 DIAGNOSIS — N390 Urinary tract infection, site not specified: Secondary | ICD-10-CM

## 2013-10-06 DIAGNOSIS — F0390 Unspecified dementia without behavioral disturbance: Secondary | ICD-10-CM

## 2013-10-06 LAB — TYPE AND SCREEN
ABO/RH(D): O POS
Unit division: 0
Unit division: 0

## 2013-10-06 LAB — CBC
HCT: 21.8 % — ABNORMAL LOW (ref 36.0–46.0)
MCH: 28.7 pg (ref 26.0–34.0)
MCV: 88.3 fL (ref 78.0–100.0)
RBC: 2.47 MIL/uL — ABNORMAL LOW (ref 3.87–5.11)
WBC: 5.4 10*3/uL (ref 4.0–10.5)

## 2013-10-06 LAB — BASIC METABOLIC PANEL
BUN: 26 mg/dL — ABNORMAL HIGH (ref 6–23)
CO2: 27 mEq/L (ref 19–32)
Chloride: 104 mEq/L (ref 96–112)
Creatinine, Ser: 1.31 mg/dL — ABNORMAL HIGH (ref 0.50–1.10)
GFR calc Af Amer: 40 mL/min — ABNORMAL LOW (ref >90)
Glucose, Bld: 105 mg/dL — ABNORMAL HIGH (ref 70–99)
Potassium: 4.2 mEq/L (ref 3.5–5.1)

## 2013-10-06 MED ORDER — SORBITOL 70 % SOLN
30.0000 mL | Freq: Every day | Status: DC
  Administered 2013-10-06: 30 mL via ORAL
  Filled 2013-10-06 (×2): qty 30

## 2013-10-06 MED ORDER — ONDANSETRON HCL 4 MG PO TABS: 4.0000 mg | ORAL_TABLET | Freq: Three times a day (TID) | ORAL | Status: AC | PRN

## 2013-10-06 MED ORDER — BELLADONNA ALKALOIDS-OPIUM 16.2-60 MG RE SUPP: 1.0000 | Freq: Four times a day (QID) | RECTAL | Status: AC | PRN

## 2013-10-06 MED ORDER — HYDROMORPHONE HCL 2 MG PO TABS
1.0000 mg | ORAL_TABLET | Freq: Four times a day (QID) | ORAL | Status: DC
  Administered 2013-10-06 – 2013-10-07 (×5): 1 mg via ORAL
  Filled 2013-10-06 (×5): qty 1

## 2013-10-06 NOTE — Consult Note (Signed)
Palliative Medicine Team Consult  Mrs. Samantha Dickerson is a 79 yo woman with moderate to severe Alzheimer's Dementia, CHF and chronic DJD pain who has newly diagnosed bladder cancer with large hematuria who is s/p cystoscopic mass excision. Called by Urology to discuss goals of care and to assist with care coordination.  I met with both patient and her daughter, patient was unable to follow conversation and did not have insight into her condition.   Summary of Goals:  1. DNR, patient has existing DNR on chart and a living will 2. Daughter is HCPOA primary goal is comfort care and to not unnecessarily prolong her life with medical interventions.  No additional lab draws  No transfusions  Maintain bladder irrigation for hematuria  Essential meds and those contributing to her comfort only.  Given these goals and her Hb of 6.5 with continuous bleeding she would be appropriate for Hospice Facility care. Daughter agrees to this plan.   My recommendation is that she be discharged to a Hospice Facility where she can get expert EOL care and attention to her comfort and QOL. She has GIP level needs and a limited prognosis.  Would maintain 3 way foley and hospice facility can also probably do continuous bladder irrigation for comfort if this is needed to prevent outlet obstruction or painful passing of clots.  Patient complains of severe deep pain in her pelvis and low back-has been persistent- her daughter tells me she often sees the look of pain on her mother's face but her mother never complains and is sensitive to pain medication.   In terms of her pain control I will use oral dilaudid in low doses-I anticipate she may need SQ or IV meds which can be given and monitored closely at hospice facility.  She may benefit from bladder anesthetic liek AZO as well.  Appreciate consult. Will follow for her ongoing care needs.  Anderson Malta, DO Palliative Medicine

## 2013-10-06 NOTE — Social Work (Signed)
Spoke with patient and her daughter at bedside to discuss possible d/c disposition to hospice home instead of SNF- Daughter reports, "Mom doesn't feel like she can continue with rehab". They both appear comfortable with this decision and focused on keeping her comfortable and near family. Referral made to [facility] for their residential/hospice setting. Clinicals faxed and will await further word from them regarding acceptance and bed availability.  Reece Levy, MSW, Theresia Majors [phone]

## 2013-10-06 NOTE — Discharge Summary (Addendum)
Date of admission: 10/03/2013  Date of discharge: 10/07/2013  Admission diagnosis: Chronic UTIs, bladder mass  Discharge diagnosis: dementia, large bladder tumor, right hydronephrosis  Secondary diagnoses:  Patient Active Problem List   Diagnosis Date Noted  . UTI (urinary tract infection) 07/11/2013  . Ventricular bigeminy 06/20/2013  . Dental infection 06/20/2013  . Dementia   . Hypertension   . Unspecified hypothyroidism   . Colon cancer   . Chronic diastolic heart failure   . Depression   . Lumbar spinal stenosis   . Pulmonary hypertension     History and Physical: For full details, please see admission history and physical. Briefly, Samantha Dickerson is a 79 y.o. year old patient with chronic multidrug resistant UTIs who presented for diagnostic cystoscopy.   Hospital Course: Patient tolerated the procedure well, please see op note for details - large bladde tumor was found and partially resected.  She was then transferred to the floor after an uneventful PACU stay.  Her Continuous bladder irrigation was weaned off on POD #1.  She had a CT scan done on POD#1 to assess the extent of her disease and was found to have right sided hydronephrosis secondary to obstruction from the bladder mass.  We had a discussion with the daughter about goals of care and elected not to treat her hydro/ obstruction.  She was evaluated by PT and Social Work and was placed into a SNF for rehab.  She was also seen by palliative care.  Please see their note for details.     On POD#4  she had met discharge criteria: was eating a regular diet, was up and ambulating with assistance,  pain was well controlled with Tylenol, she was voiding without a catheter, and was ready to for discharge to SNF.  Dr. Phillips Odor, Palliative Care consulted on patient: "My recommendation is that she be discharged to a Hospice Facility where she can get expert EOL care and attention to her comfort and QOL. She has GIP level needs and a  limited prognosis.  Would maintain 3 way foley and hospice facility can also probably do continuous bladder irrigation for comfort if this is needed to prevent outlet obstruction or painful passing of clots.  Patient complains of severe deep pain in her pelvis and low back-has been persistent- her daughter tells me she often sees the look of pain on her mother's face but her mother never complains and is sensitive to pain medication.  In terms of her pain control I will use oral dilaudid in low doses-I anticipate she may need SQ or IV meds which can be given and monitored closely at hospice facility.  She may benefit from bladder anesthetic liek AZO as well. Appreciate consult. Will follow for her ongoing care needs."    Laboratory values:  Urine culture on 10/04/13 was no growth after resection of bladder tumor.  Recent Labs  10/05/13 0540 10/06/13 0630  WBC 6.8 5.4  HGB 6.5* 7.1*  HCT 20.9* 21.8*    Recent Labs  10/05/13 0540 10/06/13 0630  NA 137 137  K 4.7 4.2  CL 107 104  CO2 25 27  GLUCOSE 108* 105*  BUN 35* 26*  CREATININE 1.58* 1.31*  CALCIUM 7.7* 7.8*   No results found for this basename: LABPT, INR,  in the last 72 hours No results found for this basename: LABURIN,  in the last 72 hours Results for orders placed during the hospital encounter of 10/03/13  URINE CULTURE  Status: None   Collection Time    10/04/13 10:16 AM      Result Value Range Status   Specimen Description URINE, CATHETERIZED   Final   Special Requests NONE   Final   Culture  Setup Time     Final   Value: 10/04/2013 16:08     Performed at [facility] Count     Final   Value: NO GROWTH     Performed at [facility]   Culture     Final   Value: NO GROWTH     Performed at [facility]   Report Status 10/05/2013 FINAL   Final    Disposition: Home  Discharge instruction: provided.  Discharge medications:    Medication List    STOP taking these  medications       clopidogrel 75 MG tablet  Commonly known as:  PLAVIX     nitrofurantoin 100 MG capsule  Commonly known as:  MACRODANTIN      TAKE these medications       CRANBERRY PO  Take 2-4 tablets by mouth daily.     ferrous sulfate 325 (65 FE) MG tablet  Take 1 tablet (325 mg total) by mouth daily with breakfast.     furosemide 20 MG tablet  Commonly known as:  LASIX  Take 20 mg by mouth daily as needed for fluid.     levothyroxine 100 MCG tablet  Commonly known as:  SYNTHROID, LEVOTHROID  Take 100 mcg by mouth daily before breakfast.     metoprolol succinate 25 MG 24 hr tablet  Commonly known as:  TOPROL-XL  Take 25 mg by mouth daily.     MYRBETRIQ 50 MG Tb24 tablet  Generic drug:  mirabegron ER  Take 50 mg by mouth daily.     ondansetron 4 MG tablet  Commonly known as:  ZOFRAN  Take 1 tablet (4 mg total) by mouth every 8 (eight) hours as needed for nausea or vomiting.     opium-belladonna 16.2-60 MG suppository  Commonly known as:  B&O SUPPRETTES  Place 1 suppository rectally every 6 (six) hours as needed for bladder spasms.     PROBIOTIC DAILY PO  Take by mouth.     PROTONIX PO  Take 1 tablet by mouth daily as needed.     ranitidine 150 MG capsule  Commonly known as:  ZANTAC  Take 150 mg by mouth as needed for heartburn.     rivastigmine 1.5 MG capsule  Commonly known as:  EXELON  Take 1.5 mg by mouth 2 (two) times daily.     sertraline 100 MG tablet  Commonly known as:  ZOLOFT  Take 1 tablet (100 mg total) by mouth daily.     TYLENOL ARTHRITIS PAIN 650 MG CR tablet  Generic drug:  acetaminophen  Take 650 mg by mouth 3 (three) times daily.        Followup:  Follow-up Information   Follow up with Crist Fat, MD In 2 weeks. (as needed)    Specialty:  Urology   Contact information:   [address] [place] [address] [place] [address] [phone]

## 2013-10-06 NOTE — Progress Notes (Signed)
PT Cancellation Note  Patient Details Name: Samantha Dickerson MRN: [id number] DOB: August 15, 1921   Cancelled Treatment:    Reason Eval/Treat Not Completed: Patient at procedure or test/unavailable Pt and family currently meeting with palliative care team.   [name] 10/06/2013, 11:24 AM Zenovia Jarred, PT, DPT 10/06/2013 Pager: [phone]

## 2013-10-06 NOTE — Telephone Encounter (Signed)
Discussed plan with her Social worker was mentioning hospice  We can ask hospice in --if appropriate---if she doesn't do well with rehab

## 2013-10-06 NOTE — Progress Notes (Signed)
Urology Inpatient Progress Report  Intv/Subj: IV removed by patient, inadvertently. CT scan yesterday - no evidence of extravesical disease, right hydro secondary to tumor obstruction Received one unit of PRBC for anemia - hgb pending this AM CBI on very slow - urine clear all day/night, blood tinged this AM C/O pelvic pain  Objective: Vital: Filed Vitals:   10/05/13 1625 10/05/13 1705 10/05/13 2324 10/06/13 0520  BP: 113/46 115/47 100/50 142/60  Pulse: 78 83 85 78  Temp: 99.1 F (37.3 C) 97.9 F (36.6 C) 99.1 F (37.3 C) 97.6 F (36.4 C)  TempSrc: Oral Oral Oral Oral  Resp: 20 18 18 16   Height:      Weight:    81.4 kg (179 lb 7.3 oz)  SpO2: 100% 100% 99% 98%   I/Os: I/O last 3 completed shifts: In: 4900.5 [P.O.:960; I.V.:1150; Blood:290.5; Other:2500] Out: 6200 [Urine:6200]  Past Medical History  Diagnosis Date  . Dementia ?2010    unclear etiology  . Hypertension   . Unspecified hypothyroidism   . Chronic diastolic heart failure     EF- 55% (4/13)  . Depression   . Lumbar spinal stenosis   . Pulmonary hypertension   . On home oxygen therapy     2 liters per [place] at hs  . Colon cancer 11/08    resected and no other Rx  . CHF (congestive heart failure)    Current Facility-Administered Medications  Medication Dose Route Frequency Provider Last Rate Last Dose  . acetaminophen (TYLENOL) tablet 1,000 mg  1,000 mg Oral Q6H PRN Crist Fat, MD   1,000 mg at 10/04/13 1549  . dextrose 5 % and 0.45 % NaCl with KCl 20 mEq/L infusion   Intravenous Continuous Crist Fat, MD 50 mL/hr at 10/03/13 2329    . famotidine (PEPCID) tablet 20 mg  20 mg Oral Daily Crist Fat, MD   20 mg at 10/05/13 1052  . ferrous sulfate tablet 325 mg  325 mg Oral Q breakfast Crist Fat, MD   325 mg at 10/05/13 6213  . furosemide (LASIX) tablet 20 mg  20 mg Oral Daily PRN Crist Fat, MD      . levothyroxine (SYNTHROID, LEVOTHROID) tablet 100 mcg  100 mcg Oral QAC  breakfast Crist Fat, MD   100 mcg at 10/05/13 0809  . menthol-cetylpyridinium (CEPACOL) lozenge 3 mg  1 lozenge Oral PRN Crist Fat, MD      . metoprolol succinate (TOPROL-XL) 24 hr tablet 25 mg  25 mg Oral Daily Crist Fat, MD   25 mg at 10/05/13 1052  . mirabegron ER (MYRBETRIQ) tablet 50 mg  50 mg Oral QPM Crist Fat, MD   50 mg at 10/05/13 1741  . ondansetron (ZOFRAN) tablet 4 mg  4 mg Oral Q8H PRN Crist Fat, MD      . opium-belladonna (B&O SUPPRETTES) suppository 1 suppository  1 suppository Rectal Q6H PRN Crist Fat, MD      . pantoprazole (PROTONIX) EC tablet 40 mg  40 mg Oral Daily Crist Fat, MD   40 mg at 10/05/13 1051  . phenol (CHLORASEPTIC) mouth spray 1 spray  1 spray Mouth/Throat PRN Crist Fat, MD      . rivastigmine (EXELON) capsule 1.5 mg  1.5 mg Oral BID Crist Fat, MD   1.5 mg at 10/05/13 2311  . sertraline (ZOLOFT) tablet 100 mg  100 mg Oral Daily Crist Fat, MD  100 mg at 10/05/13 1051    Physical Exam:  General: Patient is in no apparent distress Lungs: Normal respiratory effort, chest expands symmetrically. GI: The abdomen is soft and nontender without mass. Foley draining clear yellow urine on a slow CBI drip Ext: lower extremities symmetric  Lab Results:  Recent Labs  10/04/13 0522 10/05/13 0540 10/06/13 0630  WBC 9.0 6.8 5.4  HGB 7.8* 6.5* 7.1*  HCT 24.4* 20.9* 21.8*    Recent Labs  10/04/13 0522 10/05/13 0540 10/06/13 0630  NA 136 137 137  K 4.8 4.7 4.2  CL 103 107 104  CO2 24 25 27   GLUCOSE 120* 108* 105*  BUN 34* 35* 26*  CREATININE 1.48* 1.58* 1.31*  CALCIUM 8.3* 7.7* 7.8*   No results found for this basename: LABPT, INR,  in the last 72 hours No results found for this basename: LABURIN,  in the last 72 hours Results for orders placed during the hospital encounter of 10/03/13  URINE CULTURE     Status: None   Collection Time    10/04/13 10:16 AM       Result Value Range Status   Specimen Description URINE, CATHETERIZED   Final   Special Requests NONE   Final   Culture  Setup Time     Final   Value: 10/04/2013 16:08     Performed at [facility] Count     Final   Value: NO GROWTH     Performed at [facility]   Culture     Final   Value: NO GROWTH     Performed at [facility]   Report Status 10/05/2013 FINAL   Final    Studies/Results: CT: 1. Severe irregular bladder wall thickening along the right side of  the bladder measuring approximately 6.1 x 3 cm. The mass occludes  the right UVJ resulting in severe right hydroureteronephrosis. There  is mild pericystic inflammatory change. There is no pelvic  lymphadenopathy.  2. There is focal area of left lower lobe airspace disease  dependently likely representing atelectasis. . There is right  basilar atelectasis.   Assessment: 3 Days Post-Op 1. Patient recovered well from TURBT, urine clearing on slow CBI. Will await final pathology report prior to making any long-term treatment decisions, suspect the patient has high-grade invasive transitional cell carcinoma. Family your patient not eager to proceed with aggressive therapy.  Have decided not to treat right sided hydronephrosis. 2. Anemia secondary to acute surgical blood loss 3. Alzheimer's disease stable 4. UTI cleared   Plan: D/c foley Palliative care consult Encouraged ambulation, out of bed to chair today, PT for eval and treatment Resume Alzheimer's home medications, we'll work to keep patient oriented and comfortable. Plan to d/c to SNF  Has f/u with me in 10 days.   Berniece Salines W 10/06/2013, 7:23 AM

## 2013-10-06 NOTE — Social Work (Signed)
[facility] has confirmed a SNF bed for patient- updated daughter, [name], by phone- CSW will f/u tomorrow for ?d/c. FL2 on chart for MD signature-  Sanskriti Greenlaw, MSW, [employer] [phone]

## 2013-10-06 NOTE — Social Work (Signed)
Received call from patient's daughter that they are now wanting to pursue SNF and not hospice residential home placement. SNF preference is [facility] per daughter and I have left message at SNF to determine if offer can be made- will advise- Reece Levy, MSW, Theresia Majors [phone]

## 2013-10-06 NOTE — Telephone Encounter (Signed)
Samantha Dickerson is asking for you to call her back about something you told her this morning.  She wouldn't go into detail.

## 2013-10-07 NOTE — Telephone Encounter (Addendum)
Samantha Dickerson pts daughter left v/m requesting cb from Dr Alphonsus Sias [phone]; [facility] does not have opening for pt for rehab and Samantha Dickerson wants to know what to do; Samantha Dickerson wants to go to [facility] so Dr Alphonsus Sias can continue being pts doctor. Please advise.

## 2013-10-07 NOTE — Progress Notes (Signed)
Physical Therapy Treatment Patient Details Name: Samantha Dickerson MRN: [id number] DOB: 13-Mar-1921 Today's Date: 10/07/2013 Time: 0920-0949 PT Time Calculation (min): 29 min  PT Assessment / Plan / Recommendation  History of Present Illness 79 year old female with a history of recurrent urinary tract infections. Patient's symptoms started approximately 5 weeks ago. She has been on a number of antibiotics including ciprofloxacin 250 mg twice a day for 10 days, nitrofurantoin 100 mg twice a day 10 days, and Bactrim single strength twice a day 10 days. Her symptoms have progressed despite these antibiotics.. Her most recent culture from August 30 reveals a multidrug resistant UTI with Proteus and VRE. The patient has a history of urinary tract infections over a wedge had been treated with antibiotics. In addition the patient is currently incontinent and spends most of her day in a wet diaper. She does not think that she empties her bladder with each void. The patient is currently suffering from mild Alzheimer's disease and forgets to go to the bathroom periodically. Pt s/p TURBT 11/7 with suspicion for high-grade invasive transitional cell carcinoma     PT Comments   Pt progressing towards goals as pt able to perform sit<>stand and stand pivot transfers with min assist due to weakness and fatigue. Pt not able to ambulate today due to fatigue but able to perform LE exercises. Pt would continue to benefit from skilled PT to improve functional mobility and safety.  Follow Up Recommendations  SNF     Does the patient have the potential to tolerate intense rehabilitation     Barriers to Discharge        Equipment Recommendations  None recommended by PT    Recommendations for Other Services    Frequency Min 3X/week   Progress towards PT Goals Progress towards PT goals: Progressing toward goals  Plan Current plan remains appropriate    Precautions / Restrictions Precautions Precautions:  Fall Restrictions Weight Bearing Restrictions: No   Pertinent Vitals/Pain No c/o pain/SOB during session.    Mobility  Bed Mobility Bed Mobility: Supine to Sit;Sitting - Scoot to Edge of Bed Supine to Sit: 3: Mod assist;HOB elevated Sitting - Scoot to Edge of Bed: 4: Min assist Details for Bed Mobility Assistance: assist to guide trunk into sitting and to assist LE's off EOB. VC's to place LEs off EOB in order to sit EOB, as pt would move LEs back into bed once over EOB. Transfers Transfers: Sit to Stand;Stand to [employer] Transfers Sit to Stand: 3: Mod assist;From bed Stand to Sit: 4: Min guard;To chair/3-in-1;With upper extremity assist;With armrests Stand Pivot Transfers: 4: Min assist Details for Transfer Assistance: mod A during first sit to stand from bed to arise and steady with pt progressing to min A during last 2 sit to stand transfers and stand pivot to guide RW. Pt reports feeling weak and lightheaded upon standing and required several seated rest breaks prior to stand pivot transfer. VC's for hand placement and transfer sequence. Tactile cues required during stand to sit for hand placement. Ambulation/Gait Ambulation/Gait Assistance: Not tested (comment) Ambulation/Gait Assistance Details: not assessed as pt reports feeling weak and lightheaded upon standing.    Exercises General Exercises - Lower Extremity Ankle Circles/Pumps: AROM;20 reps;Supine;Both (increased time during all exercises due to pt easily distracted and due to fatigue.) Quad Sets: AROM;20 reps;Supine;Both Hip ABduction/ADduction: AROM;20 reps;Supine;Both   PT Diagnosis:    PT Problem List:   PT Treatment Interventions:     PT Goals (current goals can  now be found in the care plan section)    Visit Information  Last PT Received On: 10/07/13 Assistance Needed: +1 History of Present Illness: 79 year old female with a history of recurrent urinary tract infections. Patient's symptoms started  approximately 5 weeks ago. She has been on a number of antibiotics including ciprofloxacin 250 mg twice a day for 10 days, nitrofurantoin 100 mg twice a day 10 days, and Bactrim single strength twice a day 10 days. Her symptoms have progressed despite these antibiotics.. Her most recent culture from August 30 reveals a multidrug resistant UTI with Proteus and VRE. The patient has a history of urinary tract infections over a wedge had been treated with antibiotics. In addition the patient is currently incontinent and spends most of her day in a wet diaper. She does not think that she empties her bladder with each void. The patient is currently suffering from mild Alzheimer's disease and forgets to go to the bathroom periodically. Pt s/p TURBT 11/7 with suspicion for high-grade invasive transitional cell carcinoma      Subjective Data      Cognition  Cognition Arousal/Alertness: Awake/alert Behavior During Therapy: WFL for tasks assessed/performed Overall Cognitive Status: History of cognitive impairments - at baseline    Balance  Balance Balance Assessed: Yes Static Standing Balance Static Standing - Balance Support: Bilateral upper extremity supported;During functional activity Static Standing - Level of Assistance: 5: Stand by assistance Static Standing - Comment/# of Minutes: pt able to maintain static standing balance with RW and min guard for approx. 2 minutes, so PT could perform bathroom hygiene.  End of Session PT - End of Session Activity Tolerance: Patient limited by fatigue Patient left: in chair;with call bell/phone within reach;with chair alarm set   GP     Sol Blazing 10/07/2013, 10:26 AM

## 2013-10-07 NOTE — Progress Notes (Signed)
I have reviewed this note and agree with all findings. Kati Gust Eugene, PT, DPT Pager: [phone]   

## 2013-10-07 NOTE — Progress Notes (Signed)
Urology Inpatient Progress Report  Intv/Subj: CBI weaned off - foley blood tinged.   IVF [facility] Palliative care consult - DNR established Appetite good - no nausea/emesis No complaints this AM  Objective: Vital: Filed Vitals:   10/05/13 2324 10/06/13 0520 10/06/13 1450 10/06/13 2037  BP: 100/50 142/60 115/45 124/46  Pulse: 85 78 74 74  Temp: 99.1 F (37.3 C) 97.6 F (36.4 C) 98.2 F (36.8 C) 98.3 F (36.8 C)  TempSrc: Oral Oral Oral Oral  Resp: 18 16 18 20   Height:      Weight:  81.4 kg (179 lb 7.3 oz)    SpO2: 99% 98% 94% 100%   I/Os: I/O last 3 completed shifts: In: 3020.5 [P.O.:1080; I.V.:350; Blood:290.5; Other:1300] Out: 4001 [Urine:4000; Emesis/NG output:1]  Past Medical History  Diagnosis Date  . Dementia ?2010    unclear etiology  . Hypertension   . Unspecified hypothyroidism   . Chronic diastolic heart failure     EF- 55% (4/13)  . Depression   . Lumbar spinal stenosis   . Pulmonary hypertension   . On home oxygen therapy     2 liters per [place] at hs  . Colon cancer 11/08    resected and no other Rx  . CHF (congestive heart failure)    Current Facility-Administered Medications  Medication Dose Route Frequency Provider Last Rate Last Dose  . acetaminophen (TYLENOL) tablet 1,000 mg  1,000 mg Oral Q6H PRN Crist Fat, MD   1,000 mg at 10/04/13 1549  . famotidine (PEPCID) tablet 20 mg  20 mg Oral Daily Crist Fat, MD   20 mg at 10/06/13 1210  . furosemide (LASIX) tablet 20 mg  20 mg Oral Daily PRN Crist Fat, MD      . HYDROmorphone (DILAUDID) tablet 1 mg  1 mg Oral Q6H Edsel Petrin, DO   1 mg at 10/07/13 0148  . levothyroxine (SYNTHROID, LEVOTHROID) tablet 100 mcg  100 mcg Oral QAC breakfast Crist Fat, MD   100 mcg at 10/06/13 [phone]  . menthol-cetylpyridinium (CEPACOL) lozenge 3 mg  1 lozenge Oral PRN Crist Fat, MD      . metoprolol succinate (TOPROL-XL) 24 hr tablet 25 mg  25 mg Oral Daily Crist Fat, MD    25 mg at 10/06/13 1210  . mirabegron ER (MYRBETRIQ) tablet 50 mg  50 mg Oral QPM Crist Fat, MD   50 mg at 10/06/13 1821  . ondansetron (ZOFRAN) tablet 4 mg  4 mg Oral Q8H PRN Crist Fat, MD      . opium-belladonna (B&O SUPPRETTES) suppository 1 suppository  1 suppository Rectal Q6H PRN Crist Fat, MD      . pantoprazole (PROTONIX) EC tablet 40 mg  40 mg Oral Daily Crist Fat, MD   40 mg at 10/06/13 1210  . phenol (CHLORASEPTIC) mouth spray 1 spray  1 spray Mouth/Throat PRN Crist Fat, MD      . sertraline (ZOLOFT) tablet 100 mg  100 mg Oral Daily Crist Fat, MD   100 mg at 10/06/13 1209  . sorbitol 70 % solution 30 mL  30 mL Oral QHS Edsel Petrin, DO   30 mL at 10/06/13 2200    Physical Exam:  General: Patient is in no apparent distress Lungs: Normal respiratory effort, chest expands symmetrically. GI: The abdomen is soft and nontender without mass. Foley draining clear yellow urine Ext: lower extremities symmetric  Lab Results:  Recent  Labs  10/05/13 0540 10/06/13 0630  WBC 6.8 5.4  HGB 6.5* 7.1*  HCT 20.9* 21.8*    Recent Labs  10/05/13 0540 10/06/13 0630  NA 137 137  K 4.7 4.2  CL 107 104  CO2 25 27  GLUCOSE 108* 105*  BUN 35* 26*  CREATININE 1.58* 1.31*  CALCIUM 7.7* 7.8*   No results found for this basename: LABPT, INR,  in the last 72 hours No results found for this basename: LABURIN,  in the last 72 hours Results for orders placed during the hospital encounter of 10/03/13  URINE CULTURE     Status: None   Collection Time    10/04/13 10:16 AM      Result Value Range Status   Specimen Description URINE, CATHETERIZED   Final   Special Requests NONE   Final   Culture  Setup Time     Final   Value: 10/04/2013 16:08     Performed at [facility] Count     Final   Value: NO GROWTH     Performed at [facility]   Culture     Final   Value: NO GROWTH     Performed at [facility]   Report Status 10/05/2013 FINAL   Final    Studies/Results: CT: 1. Severe irregular bladder wall thickening along the right side of  the bladder measuring approximately 6.1 x 3 cm. The mass occludes  the right UVJ resulting in severe right hydroureteronephrosis. There  is mild pericystic inflammatory change. There is no pelvic  lymphadenopathy.  2. There is focal area of left lower lobe airspace disease  dependently likely representing atelectasis. . There is right  basilar atelectasis.   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 Anemia secondary to acute surgical blood loss 3. Alzheimer's disease stable 4. UTI cleared 5. DNR   Plan: Plan to keep foley for the short term until more mobile and bleeding stops - okay to d/c catheter Appreciate palliative care consult - will plan for SNF and move towards hospice as appropriate. Plan to d/c to SNF today. I am turning over patient's care back to Dr. Alphonsus Sias.  I will be available as needed.  I have given patient's daughter pathology report and prognosis.  She understands aggressive nature of tumor and expected course.  Patient may need clot evacuation due to tumor bleeding at some point to help keep comfortable.  I will cancel my follow-up appointment and plan to see her PRN.  Cc: Dr. Tillman Abide.   Berniece Salines W 10/07/2013, 6:21 AM

## 2013-10-07 NOTE — Progress Notes (Signed)
Report called to [facility] rn  At [employer].

## 2013-10-07 NOTE — Telephone Encounter (Signed)
Spoke to daughter, her husband Joe--- and Sue Lush the admissions coordinator at [facility] No beds available now They will take the bed offered at [facility] for now---and we will try to get lateral transfer to [facility] when a bed is available

## 2013-11-19 ENCOUNTER — Emergency Department: Payer: Self-pay | Admitting: Emergency Medicine

## 2013-11-20 LAB — URINALYSIS, COMPLETE: Specific Gravity: 1.017 (ref 1.003–1.030)

## 2013-11-20 LAB — COMPREHENSIVE METABOLIC PANEL
Anion Gap: 6 — ABNORMAL LOW (ref 7–16)
BUN: 44 mg/dL — ABNORMAL HIGH (ref 7–18)
Bilirubin,Total: 0.3 mg/dL (ref 0.2–1.0)
Calcium, Total: 8.4 mg/dL — ABNORMAL LOW (ref 8.5–10.1)
Co2: 36 mmol/L — ABNORMAL HIGH (ref 21–32)
Creatinine: 1.61 mg/dL — ABNORMAL HIGH (ref 0.60–1.30)
EGFR (African American): 32 — ABNORMAL LOW
EGFR (Non-African Amer.): 28 — ABNORMAL LOW
Glucose: 101 mg/dL — ABNORMAL HIGH (ref 65–99)
Osmolality: 287 (ref 275–301)
Potassium: 3.6 mmol/L (ref 3.5–5.1)
SGOT(AST): 10 U/L — ABNORMAL LOW (ref 15–37)
SGPT (ALT): 11 U/L — ABNORMAL LOW (ref 12–78)
Sodium: 138 mmol/L (ref 136–145)

## 2013-11-20 LAB — CBC WITH DIFFERENTIAL/PLATELET
Basophil #: 0.2 10*3/uL — ABNORMAL HIGH (ref 0.0–0.1)
Eosinophil #: 0.3 10*3/uL (ref 0.0–0.7)
Eosinophil %: 2.4 %
Lymphocyte #: 1.3 10*3/uL (ref 1.0–3.6)
Lymphocyte %: 11 %
MCH: 28.5 pg (ref 26.0–34.0)
MCHC: 32.1 g/dL (ref 32.0–36.0)
MCV: 89 fL (ref 80–100)
Monocyte #: 0.6 x10 3/mm (ref 0.2–0.9)
Neutrophil #: 9.7 10*3/uL — ABNORMAL HIGH (ref 1.4–6.5)
Neutrophil %: 80.2 %
Platelet: 141 10*3/uL — ABNORMAL LOW (ref 150–440)
RBC: 2.13 10*6/uL — ABNORMAL LOW (ref 3.80–5.20)
WBC: 12 10*3/uL — ABNORMAL HIGH (ref 3.6–11.0)

## 2013-12-09 ENCOUNTER — Telehealth: Payer: Self-pay | Admitting: Internal Medicine

## 2013-12-09 NOTE — Telephone Encounter (Signed)
Pt's daughter, Mardene Celeste called to let you know pt passed away yesterday around 4:30 p.m. She wants to take a moment to thank you for taking care of her mom and for everything you've done for her.  She would like for you to give her a call when you get a moment.

## 2013-12-09 NOTE — Telephone Encounter (Signed)
Called and passed on my condolences

## 2013-12-22 ENCOUNTER — Ambulatory Visit: Payer: Medicare Other | Admitting: Internal Medicine

## 2013-12-28 DEATH — deceased

## 2015-03-21 NOTE — Discharge Summary (Signed)
PATIENT NAME:  Samantha Dickerson, Samantha Dickerson MR#:  [id number] DATE OF BIRTH:  14-Apr-1921  DATE OF ADMISSION:  03/23/2012 DATE OF DISCHARGE:  03/27/2012  ADDENDUM: This is an addendum to the discharge summary dictated yesterday by Dr. Theodoro Grist. This addendum should cover 03/27/2012.   There were no acute events overnight. The patient was not discharged yesterday as we were still waiting for the PASSAR number. She has no complaints today. She has been on Macrobid for a VRE urinary tract infection. Today is day two. There was no fever overnight. Her vitals have remained stable.  At this point, we would go ahead and discharge the patient.   DISCHARGE DIAGNOSES:  1. Recurrent syncope. 2. Mild dehydration, resolved. 3. Anemia. 4. Pulmonary hypertension. 5. Nocturnal hypoxia, controlled on 2 liters of oxygen through nasal cannula. 6. 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  7. Hypertension. 8. Spinal stenosis.  9. Hypothyroidism.  10. Dementia.   DISCHARGE MEDICATIONS:  1. Levothyroxine 112 mcg daily.  2. Plavix 75 mg daily.  3. Metoprolol extended release 50 mg daily.  4. Bactrim 400/80 mg 1 tab daily, after the nitroglycerin 100 mg p.o. twice daily has been finished for 9 days. 5. Tylenol Arthritis 650 mg 2 tabs every eight hours as needed. 6. Rivastigmine 15 mg orally twice a day. 7. Lasix 20 mg daily.  8. Ferrous sulfate one tablet daily. 9. Sertraline 25 mg daily.   DIET: Low sodium, ADA diet.   ACTIVITY: As tolerated.   DISCHARGE FOLLOWUP: Please follow-up with your primary care physician in 1 to 2 days.   DISPOSITION: To rehab.   CODE STATUS: THE PATIENT IS FULL CODE.   TOTAL TIME SPENT: 35 minutes.  ____________________________ Vivien Presto, MD sa:slb D: 03/27/2012 15:23:27 ET T: 03/27/2012 15:38:32 ET JOB#: [id number]  cc: Vivien Presto, MD, <Dictator> Vivien Presto MD ELECTRONICALLY SIGNED 04/02/2012 12:09

## 2015-03-21 NOTE — H&P (Signed)
PATIENT NAME:  Samantha Dickerson, CSASZAR MR#:  [id number] DATE OF BIRTH:  07-28-1921  DATE OF ADMISSION:  03/21/2012  REFERRING PHYSICIAN:   Conni Slipper, MD   PRIMARY CARE PHYSICIAN: Valentino Saxon, MD    HISTORY OF PRESENT ILLNESS: The patient is an 81 year old female with past medical history of hypothyroidism, dementia, hypertension, who was in her usual state of health today when, while in her bedroom, she got up and felt dizzy and fell down. She is not sure if she lost consciousness or not but has been feeling very weak since then. She is also complaining of pain in her hip.   ALLERGIES: Aspirin, ACE inhibitor, penicillin, nitroglycerin.   PAST MEDICAL HISTORY:  1. Hypothyroidism. 2. Dementia. 3. Hypertension. 4. History of recurrent UTIs, on suppressive therapy.  5. Depression.   PAST SURGICAL HISTORY:  1. Abdominal aortic aneurysm repair. 2. 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 The patient is currently cancer free.   FAMILY HISTORY: Mother had congestive heart failure. Two brothers also had congestive heart failure. Father died of cancer.   SOCIAL HISTORY: She denies any history of smoking, alcohol or drug abuse. She was currently lives with her daughter.   MEDICATIONS:  1. Tylenol p.r.n.  2. Lasix 20 mg daily.  3. Rivastigmine 15 mg, 1.5 mg daily.  4. Toprol-XL 50 mg daily.  5. Synthroid 112 mcg daily.  6. Plavix 75 mg daily.  7. Zantac 150 mg b.i.d.  8. Iron 65 mg daily.  9. Zoloft 25 mg daily.  10. Bactrim Double Strength 1 tablet daily.    REVIEW OF SYSTEMS: CONSTITUTIONAL: Denies any fever. Reports fatigue and weakness. EYES: Denies any blurred or double vision. ENT: Denies any tinnitus, ear pain. RESPIRATORY: Denies any cough, wheezing. CARDIOVASCULAR: Denies any chest, palpitations. GI: Denies any nausea, vomiting, diarrhea, or abdominal pain. GU: Denies any dysuria or hematuria. ENDOCRINE: Denies polyuria, nocturia. HEME/LYMPH: Denies  any anemia, easy bruisability. MUSCULOSKELETAL: Denies any swelling, gout. NEUROLOGICAL: Denies any numbness, weakness. PSYCHIATRIC: Denies any anxiety, has a history of depression.   PHYSICAL EXAMINATION:  VITAL SIGNS: Temperature afebrile, heart rate 62, respiratory rate 18, blood pressure 129/61, pulse oximetry 96% on room air.   GENERAL: The patient is an 81 year old female lying comfortably in bed, not in acute distress.   HEENT: Head: Atraumatic, normocephalic. Eyes: Mild pallor and icterus with cyanosis. Pupils equal, round, and reactive to light and accommodation. Extraocular movements intact. ENT: Wet mucous membranes. No oropharyngeal erythema or thrush.   NECK: Supple. No masses. No JVD, no thyromegaly or lymphadenopathy.   CHEST WALL: No tenderness to palpation. Not using accessory muscles of respiration. No intercostal muscle retractions.   LUNGS: Bilaterally clear to auscultation. No wheezing, rales, or rhonchi.   CARDIOVASCULAR: S1, S2 regular. There is a systolic murmur. No rubs or gallops.   ABDOMEN: Soft, nontender, nondistended. No guarding, no rigidity. No organomegaly. Normal bowel sounds.  SKIN: No rashes or lesions.   PERIPHERIES: No pedal edema, 2+ pedal pulses.   MUSCULOSKELETAL: No cyanosis or clubbing. The patient does endorse pain on flexion of her left hip.   NEUROLOGICAL: Awake, alert, oriented x3. Nonfocal neurological exam. Cranial nerves are grossly intact.   PSYCHIATRIC: Normal mood and affect.   LABORATORY, DIAGNOSTIC AND RADIOLOGICAL DATA:  CBC normal other than hemoglobin of 11.1, hematocrit 34.4, platelets 90, glucose 84, BUN 24. Cardiac enzymes negative. No additional work-up was done by the ED physician.  EKG shows normal sinus rhythm with PACs.  ASSESSMENT AND PLAN: The patient is an 81 year old female with past medical history of hypertension, hypothyroidism, dementia, who presented with a syncopal episode.   1. Syncope, etiology is  unclear at present: We will check orthostatic vital signs, serial cardiac enzymes, MRI of the brain, carotid ultrasound, and echo. Will also get a PT evaluation.  2. Left hip pain: The patient fell. She is elderly. She is complaining of pain on flexion. We will get an x-ray of her left hip to rule out any underlying fractures.  3. Anemia and thrombocytopenia: Possibly chronic.  4. Mild dehydration with elevated BUN: We will hydrate with IV fluids. 5. Hypothyroidism: As per the patient's daughter, the dose of Synthroid was easily reduced. We will continue Synthroid at a lower dose.  6. Dementia: We will continue Rivastigmine.  7. Hypertension: Appears to be stable at present. We will continue Toprol-XL.  8. History of depression: We will continue Zoloft. We will place on GI and deep venous thrombosis prophylaxis.   I discussed with the Emergency Department physician, discussed with the patient and her daughter the plan of care and management.      TIME SPENT: 75 minutes.   ____________________________ Cherre Huger, MD sp:cbb D: 03/21/2012 21:20:20 ET T: 03/22/2012 08:08:23 ET JOB#: [id number]  cc: Cherre Huger, MD, <Dictator> Maureen P. Heber Bennett, MD Cherre Huger MD ELECTRONICALLY SIGNED 03/22/2012 17:20

## 2015-03-21 NOTE — Consult Note (Signed)
General Aspect patient is an 81 year old female with history of dementia, hypertension who was admitted after a syncopal episode. Patient is a difficult historian. Per chart and per reports from family, patient had a syncopal episode followed by some increased confusion and intermittent reduction and mental status for several hours after the syncopal event. Workup thus far the hospital has been fairly unremarkable. Brain MRI was unremarkable. EEG did not suggest seizure activity at present. Telemetry has revealed no significant pauses or tachyarrhythmias. Echocardiogram revealed preserved left ventricular function with no high-grade valvular disease. There was mild pulmonary hypertension. Patient is not able to give a history of chest pain or previous loss of consciousness. She is followed by Dr. Heber . as an outpatient.   Physical Exam:   GEN no acute distress    HEENT PERRL    NECK supple    RESP clear BS    CARD Regular rate and rhythm  Murmur    Murmur Systolic    Systolic Murmur axilla    ABD denies tenderness  normal BS  no Adominal Mass    LYMPH negative neck    EXTR negative cyanosis/clubbing, negative edema    SKIN normal to palpation    NEURO cranial nerves intact, motor/sensory function intact    PSYCH alert, poor insight   Review of Systems:   Subjective/Chief Complaint syncopal episode    General: No Complaints    Skin: No Complaints    ENT: No Complaints    Eyes: No Complaints    Neck: No Complaints    Respiratory: No Complaints    Cardiovascular: No Complaints    Gastrointestinal: No Complaints    Genitourinary: No Complaints    Vascular: No Complaints    Musculoskeletal: No Complaints    Neurologic: Fainting    Hematologic: No Complaints    Endocrine: No Complaints    Psychiatric: No Complaints    Review of Systems: All other systems were reviewed and found to be negative    Medications/Allergies Reviewed Medications/Allergies  reviewed        Admit Diagnosis:   SYNCOPE LEFT HIP PAIN HYPERTENSION: 24-Mar-2012, Active, SYNCOPE LEFT HIP PAIN HYPERTENSION      Admit Reason:   Hypertension: (401.9) Active, ICD9, Unspecified essential hypertension  Home Medications: Medication Instructions Status  levothyroxine 112 mcg (0.112 mg) oral tablet 1 tab(s) orally once a day Active  clopidogrel 75 mg oral tablet 1 tab(s) orally once a day Active  metoprolol succinate 50 mg oral tablet, extended release 1 tab(s) orally once a day Active  Bactrim 400 mg-80 mg oral tablet tab(s) orally once a day Active  Tylenol Arthritis Caplet 650 mg oral tablet, extended release 2 tab(s) orally every 8 hours, As Needed Active  rivastigmine 15 milligram(s) orally twice daily Active  Lasix 40 mg oral tablet 0.5 tab(s) orally once a day Active  ferrous sulfate 1  orally once a day (at bedtime) Active  sertraline 25 mg oral tablet 1 tab(s) orally once a day Active   EKG:   EKG NSR    Aspirin: GI Distress  Penicillin: Rash  Ace Inhibitors: Hives  Nitroglycerin: Unknown    Impression 81 year old female with history of dementia and hypertension who was admitted after a presumed syncopal episode. Workup thus far is been somewhat unremarkable. Brain MRI did not reveal significant intracerebral event. EEG did not suggest seizure activity. CT scan of the chest showed mildly dilated with very vessels with evidence of mild pulmonary hypertension on echocardiogram with preserved  left ventricular function and no high-grade valvular disease. There is no evidence of significant arrhythmia on telemetry. She does have evidence of a probable urinary tract infection. Etiology of syncope he has thus far unclear. Does not appear to be arrhythmogenic or ischemic by workup thus far.    Plan 1. Continue with current medical regimen 2. Pulmonary hypertension appears mild and not related to valvular heart disease. Possible nocturnal hypoxia could be playing a  role. Overnight pulse oximetry may be helpful in determining this. 3. Overnight pulse oximetry will be ordered 4. No further cardiac workup indicated at present unless clinical course changes.   Electronic Signatures: Teodoro Spray (MD)  (Signed 28-Apr-13 15:46)  Authored: General Aspect/Present Illness, History and Physical Exam, Review of System, Health Issues, Home Medications, EKG , Allergies, Impression/Plan   Last Updated: 28-Apr-13 15:46 by Teodoro Spray (MD)

## 2015-03-21 NOTE — Discharge Summary (Signed)
PATIENT NAME:  Samantha Dickerson, Samantha Dickerson MR#:  [id number] DATE OF BIRTH:  1921-03-11  DATE OF ADMISSION:  03/23/2012 DATE OF DISCHARGE:  03/26/2012  ADMITTING DIAGNOSIS: Syncope.  DISCHARGE DIAGNOSES:  1. Recurrent syncope. 2. Normal electroencephalogram.  3. Mild dehydration, resolved.  4. Anemia. 5. Pulmonary hypertension. 6. Nocturnal hypoxia controlled on 2 liters of oxygen through nasal cannula.  7. Urinary tract infection, Enterococcus faecalis, vancomycin resistant Enterococcus.   8. History of hypertension. 9. Spinal stenosis. 10. Hypothyroidism. 11. Dementia all stable.   DISCHARGE CONDITION: Stable.   DISCHARGE MEDICATIONS: Patient is to resume her outpatient medications which are:  1. Levothyroxine 112 mcg p.o. daily.  2. Clopidogrel 75 mg p.o. daily.  3. Metoprolol succinate 50 mg p.o. daily.  4. Bactrim 400/80, 1 tablet daily.  5. Tylenol arthritis 2 caplets q.8 hours.  6. Rivastigmine 15 mg p.o. twice daily.  7. Lasix 40 mg half tablet which will be 20 mg p.o. daily.  8. Iron sulfate 1 tablet, unknown dose daily.  9. Sertraline 25 mg p.o. daily.  10. Nitrofurantoin 100 mg p.o. twice daily for 10 days.   DIET: 2 grams salt.   ACTIVITY LIMITATIONS: As tolerated.   REFERRAL: Physical therapy 2 to 7 times a week.   FOLLOW UP: Follow-up appointment with Dr. Heber Wellington in two days after discharge.    CONSULTANTS:  1. Dr. Manuella Ghazi. 2. Dr. Ubaldo Glassing. 3. Care management.   HISTORY OF PRESENT ILLNESS: Patient is an 81 year old Caucasian female with past medical history significant for history of dementia, hypothyroidism, hypertension, history of recurrent urinary tract infections on suppressive therapy presented to the hospital after syncopal episode. Please refer to Dr. Kelton Pillar admission on 03/21/2012. On arrival to Emergency Room she was afebrile, her heart rate was 62, respiration rate 18, blood pressure 129/61, saturation was 96% on room air. Physical examination was unremarkable.  Patient did have some lower extremity edema, however.   LABORATORY, DIAGNOSTIC AND RADIOLOGICAL DATA: Chest, one view 03/22/2012 showed cardiomegaly, which is unchanged though slightly increased from prior given differences in technique. Otherwise, no acute cardiopulmonary disease. Left hip complete x-ray 03/21/2012 showed no fracture. MRI of brain without contrast 03/22/2012 showed no acute abnormality. Ultrasound of carotid arteries 03/22/2012 showed no hemodynamically significant stenosis in extracranial carotid arteries. Ultrasound of lower extremities bilaterally due to swelling: No evidence of deep vein thrombosis in bilateral lower extremities. CT scan of chest for pulmonary embolism with contrast 03/23/2012 showed no CT evidence of pulmonary embolus. Right and left main pulmonary arteries are mildly dilated measuring 3 cm in diameter as can be seen with pulmonary arterial hypertension. Echocardiogram 03/22/2012 showed left ventricular systolic function low normal, ejection fraction of more than 55%. There is mild mitral regurgitation. There is mild to moderate tricuspid regurgitation. Right ventricular systolic pressure is elevated to 30 to 40 mmHg. Aortic valve opens well.   Patient's labs data 03/21/2012 showed mildly elevated BUN of 24, otherwise unremarkable BMP. Patient's cardiac enzymes all three sets were within normal limits. White blood cell count was normal at 4.2, hemoglobin 11.1, platelet count 90. EKG showed sinus rhythm at 65 beats per minute, possible premature atrial complexes with aberrant conduction, inferior infarct, age undetermined. No acute ST-T changes were noted. Patient's urinalysis showed clear straw-colored urine, negative for glucose, bilirubin or ketones, specific gravity 1.010, pH 5.0, negative for blood, protein and nitrites, trace leukocyte esterase was noted, less than 1 red blood cells, 10 white cells, 1+ bacteria were seen. Urine culture showed Enterococcus faecalis  resistant to  tetracycline, also ciprofloxacin, levofloxacin, vancomycin as well as gentamicin; sensitive to ampicillin as well as nitrofurantoin.   HOSPITAL COURSE: Patient was admitted to the hospital and she was worked up for syncopal episode. She had MRI of her brain as well as carotid ultrasound and echocardiogram, all of them where unremarkable except of right-sided elevated pressures as well as pulmonary hypertension which was diagnosed with echocardiogram. It was felt that patient's elevated pulmonary pressures were very likely related to nocturnal hypoxia for which patient is on oxygen therapy at nighttime. Cardiologist was consulted, however, he did not feel that this could have contributed to her syncopal episodes. Neurology consultation was obtained. Electroencephalogram was performed which was unremarkable. Neurology consultation outpatient was also obtained and Dr. Manuella Ghazi saw patient in consultation and felt that patient's syncope is of unclear etiology at this time. He did not feel it was seizure related. He also felt that patient's fall could have caused concussion and peri-event amnesia.   In regards to her recurrent falls, he felt that this was very likely multifactorial related to decreased cerebral compliance as well as peripheral neuropathy and arthritis as well as painful lower extremity edema. He recommended to continue physical therapy.   In regards to dementia. It was unclear per Dr. Manuella Ghazi how extensive her dementia is without history, however, he felt that patient very likely has Alzheimer's disease. He recommended to get patient to rehabilitation for strengthening. Patient was evaluated by physical therapist here in the hospital who felt that rehabilitation would be appropriate for this patient. Patient's family was agreeable to that. Patient will be discharged to rehab center today, 03/26/2012. It was unclear why patient had syncope, however, it could have been that patient had  recurrent urinary tract infection which could have contributed to her syncope episode. She also was somewhat dehydrated. Patient received IV fluids and her dehydration resolved. She started therapy for urinary tract infection VRE. She started nitrofurantoin for 10 days. She is then to resume her usual doses of Bactrim suppressive therapy for her recurrent urinary tract infections versus to continue nitrofurantoin. She is to follow up with her primary care physician, Dr. Heber [employer], and make decisions about antibiotic therapy long term.   In regards to hypertension, patient is to continue her outpatient medications.   In regards to lower extremity swelling, patient was noted to have no deep vein thrombosis. She is to continue management of her lower extremity swelling as previously ordered.   Patient was noted to have thrombocytopenia, however, patient's thrombocytopenia was stable and somewhat improved by day of discharge.   Patient is being discharged in table condition with above-mentioned medications and follow up. Her vital signs on day of discharge: Temperature 97.5, pulse 83, respiration rate 20, blood pressure 105/69, saturation 93% on room air at rest.   TIME SPENT: 40 minutes.   ____________________________ Theodoro Grist, MD rv:cms D: 03/26/2012 13:43:19 ET T: 03/26/2012 14:11:05 ET JOB#: [id number]  cc: Theodoro Grist, MD, <Dictator> Maureen P. Heber [place], MD Theodoro Grist MD ELECTRONICALLY SIGNED 04/06/2012 14:15

## 2015-10-21 IMAGING — CT CT ABD-PEL WO/W CM
3 of 6 series · 16 of 46 positions shown, 18 images · IV contrast (OMNIPAQUE)
Comparison: None.

CLINICAL DATA: Staging of newly diagnosed right-sided necrotic
bladder cancer.

EXAM:
CT ABDOMEN AND PELVIS WITHOUT AND WITH CONTRAST
TECHNIQUE: Multidetector CT imaging of the abdomen and pelvis was performed
without contrast material in one or both body regions, followed by
contrast material(s) and further sections in one or both body
regions.
CONTRAST:  50mL OMNIPAQUE IOHEXOL 300 MG/ML SOLN, 80mL OMNIPAQUE
IOHEXOL 300 MG/ML SOLN

[Series 2: rtn a/p w/o · axial · non-contrast · 0.87mm/px · z∈[-404,-44]mm · 9 of 92 slices shown, 11 images]
[im 10/92  soft-tissue]
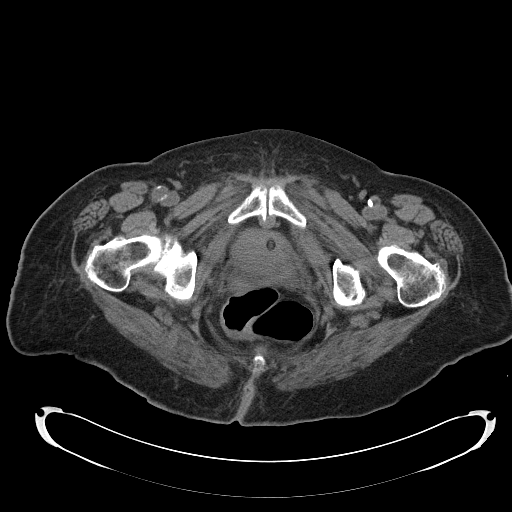
[im 10/92  bone]
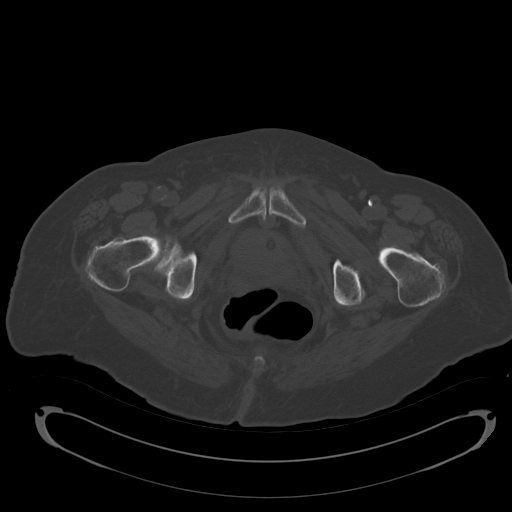
[im 19/92  soft-tissue]
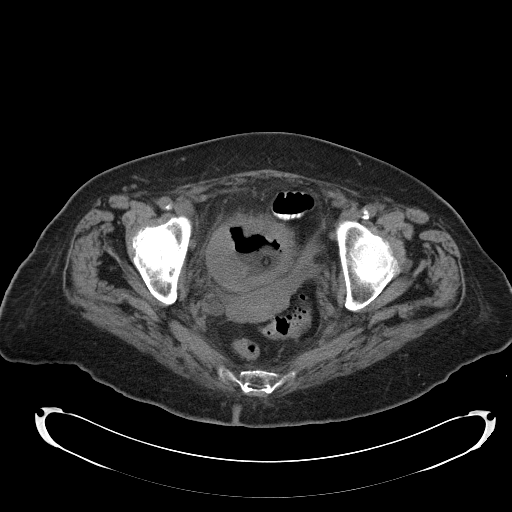
[im 28/92  soft-tissue]
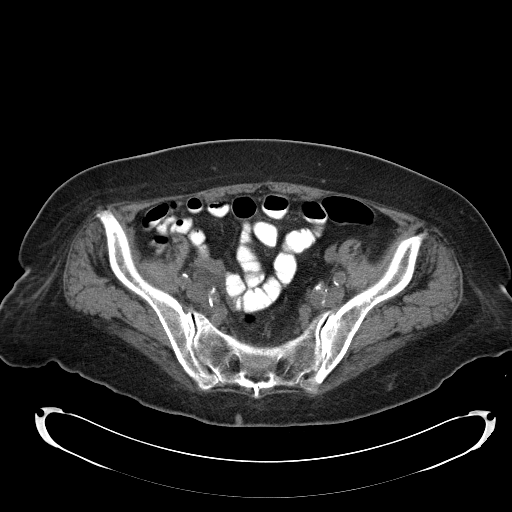
[im 37/92  soft-tissue]
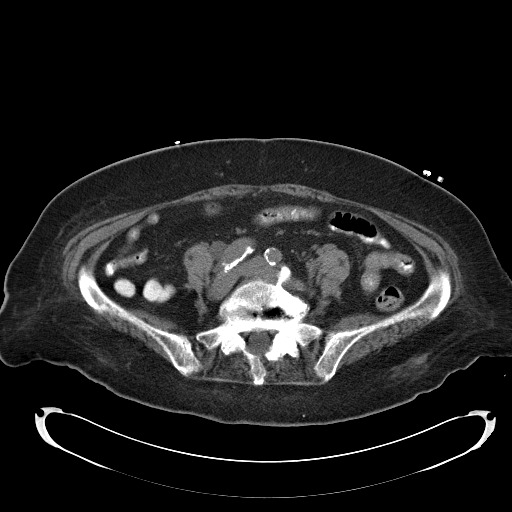
[im 46/92  soft-tissue]
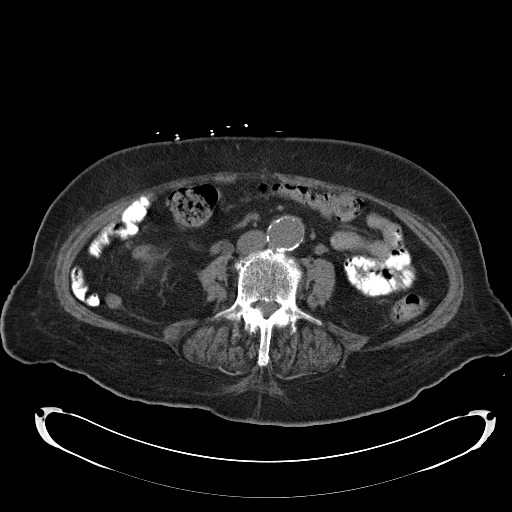
[im 55/92  soft-tissue]
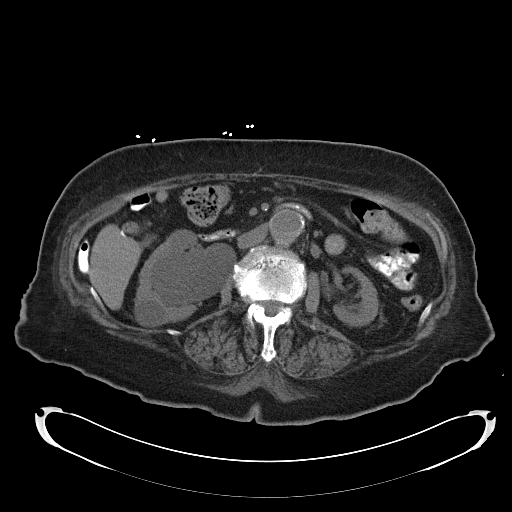
[im 64/92  soft-tissue]
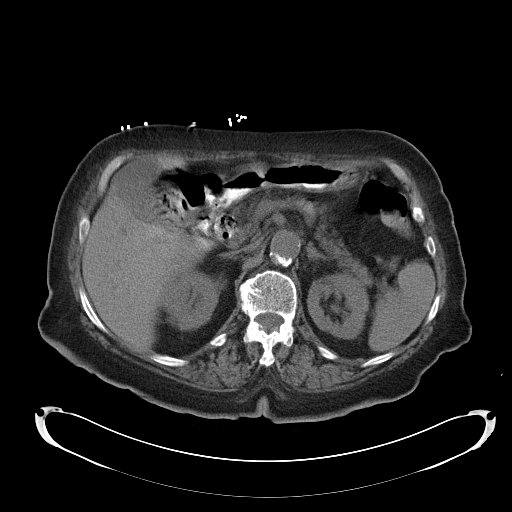
[im 73/92  soft-tissue]
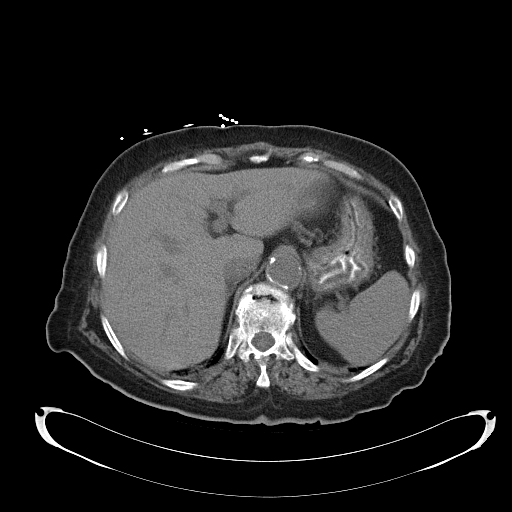
[im 82/92  soft-tissue]
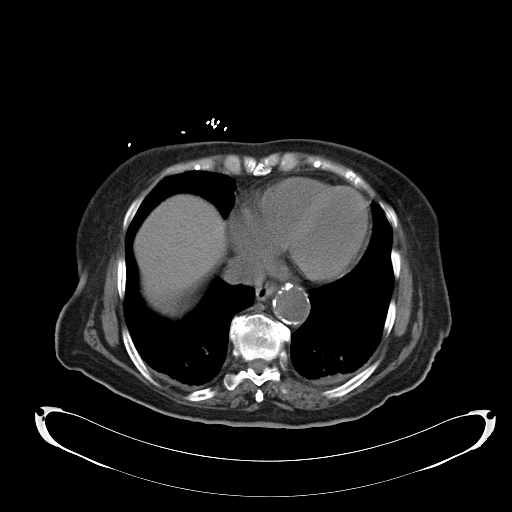
[im 82/92  bone]
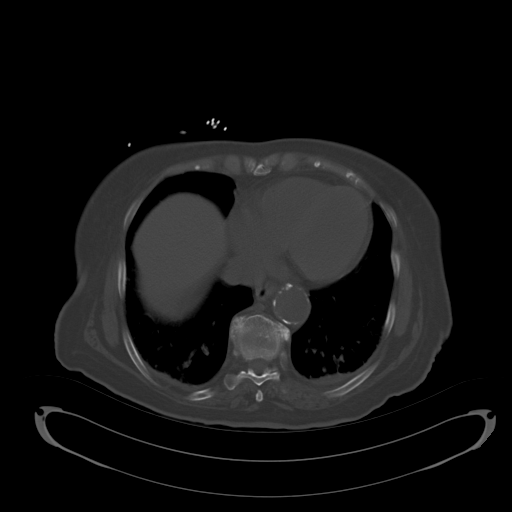

[Series 4: rtn a/p with · axial · 0.87mm/px · z∈[-398,-248]mm · 4 of 92 slices shown]
[im 11/92  soft-tissue]
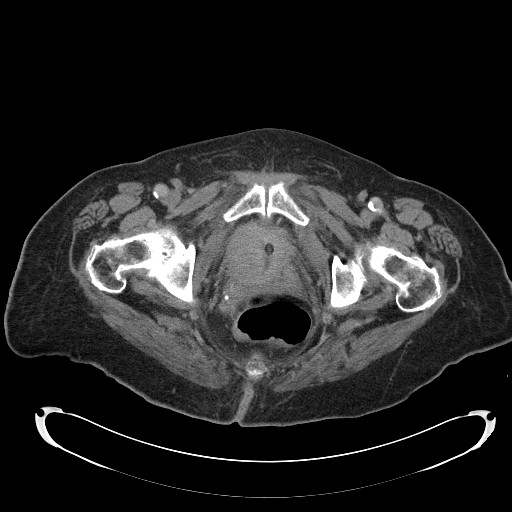
[im 21/92  soft-tissue]
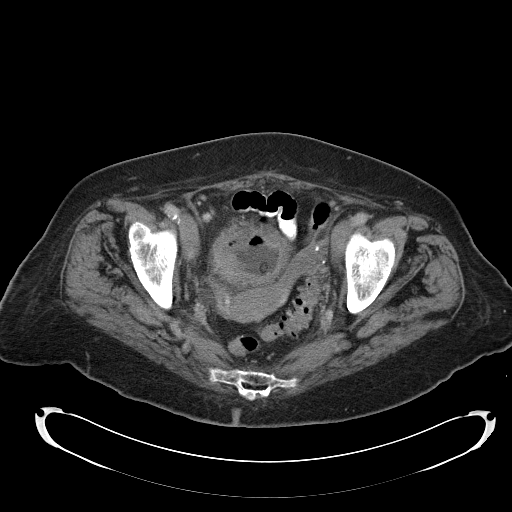
[im 31/92  soft-tissue]
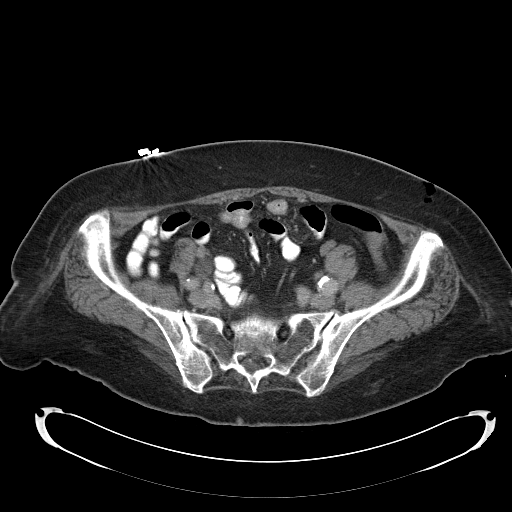
[im 41/92  soft-tissue]
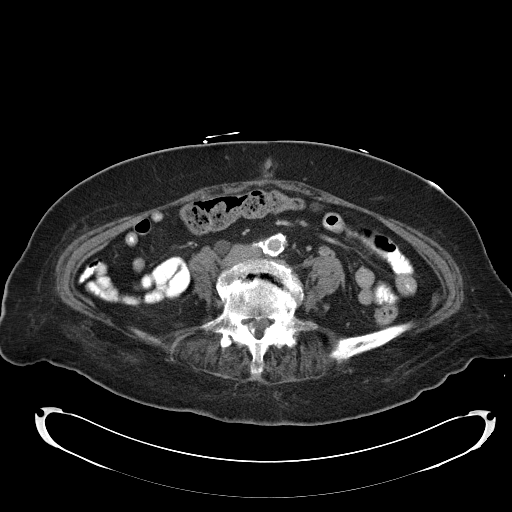

[Series 7: coronal · coronal · 0.89mm/px · 3 of 130 slices shown]
[im 44/130  soft-tissue]
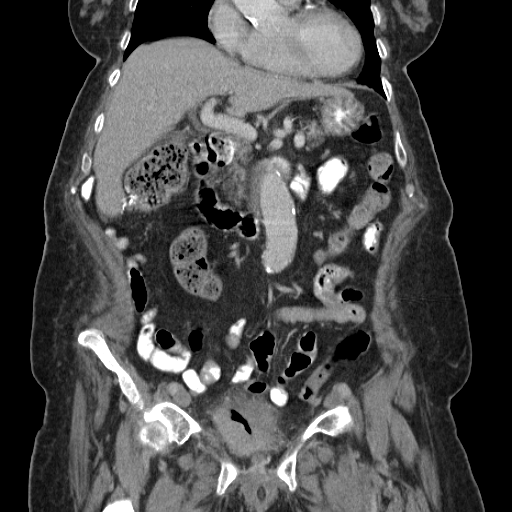
[im 58/130  soft-tissue]
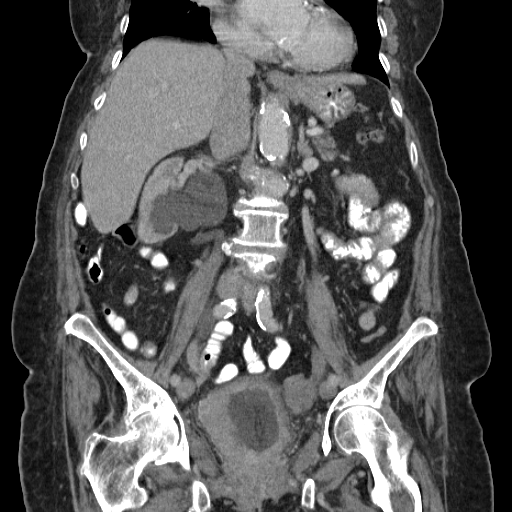
[im 72/130  soft-tissue]
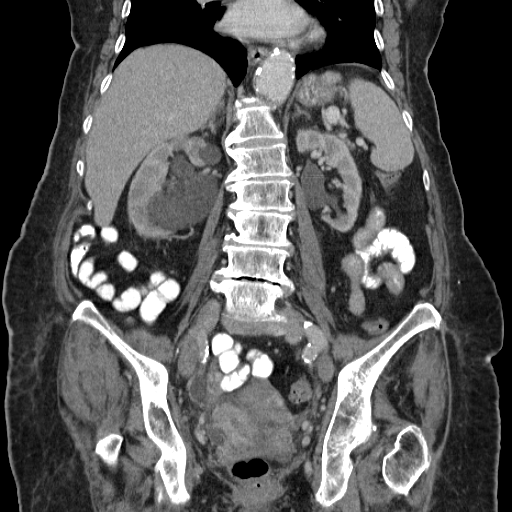

[16 of 46 positions shown; findings below may reference images not displayed]

FINDINGS: Intravenous contrast was administered. The patient's GFR is 28 today
with a creatinine of 1.48.

There is focal area of left lower lobe airspace disease dependently
likely representing atelectasis. . There is right basilar
atelectasis.

There is no urolithiasis. There is a 3.2 cm hypodense, fluid
attenuating right renal mass most consistent with a cyst. Bilateral
kidneys enhance normally and symmetrically. There is normal
excretion of contrast into the left renal collecting system. There
is a small amount of excreted contrast pooling within the right
renal calices. There is severe irregular bladder wall thickening
primarily along the right side of the bladder resulting in severe
right hydroureteronephrosis. The mass slight bladder wall thickening
along the right side of the bladder measures approximately 6.1 x 3
cm. There is a Foley catheter present within the bladder. There is
pericystic inflammatory change.

The liver demonstrates no focal abnormality. There is no
intrahepatic or extrahepatic biliary ductal dilatation. The
gallbladder is unremarkable. The spleen demonstrates no focal
abnormality. The adrenal glands and pancreas are normal.

The stomach, duodenum, small intestine, and large intestine
demonstrate abnormal bowel dilatation or bowel wall thickening.
There is no extravasation of contrast. There is evidence of prior
colonic resection with an anastomotic suture line at the hepatic
flexure. There is no pneumoperitoneum, pneumatosis, or portal venous
gas. There is no abdominal or pelvic free fluid. There is no
lymphadenopathy.

There is an infrarenal abdominal aortic aneurysm measuring 4 x
cm. .

There is severe lumbar spine spondylosis.
IMPRESSION: 1. Severe irregular bladder wall thickening along the right side of
the bladder measuring approximately 6.1 x 3 cm. The mass occludes
the right UVJ resulting in severe right hydroureteronephrosis. There
is mild pericystic inflammatory change. There is no pelvic
lymphadenopathy.

2. There is focal area of left lower lobe airspace disease
dependently likely representing atelectasis. . There is right
basilar atelectasis.
